# Patient Record
Sex: Male | Born: 1935 | Race: White | Hispanic: No | Marital: Married | Smoking: Never smoker
Health system: Southern US, Community
[De-identification: ages and names within clinical notes are randomized; demographics above are authoritative.]

## PROBLEM LIST (undated history)

## (undated) DIAGNOSIS — C61 Malignant neoplasm of prostate: Secondary | ICD-10-CM

## (undated) DIAGNOSIS — R011 Cardiac murmur, unspecified: Secondary | ICD-10-CM

## (undated) DIAGNOSIS — I82409 Acute embolism and thrombosis of unspecified deep veins of unspecified lower extremity: Secondary | ICD-10-CM

## (undated) DIAGNOSIS — Z8673 Personal history of transient ischemic attack (TIA), and cerebral infarction without residual deficits: Secondary | ICD-10-CM

## (undated) DIAGNOSIS — N183 Chronic kidney disease, stage 3 unspecified: Secondary | ICD-10-CM

## (undated) DIAGNOSIS — I779 Disorder of arteries and arterioles, unspecified: Secondary | ICD-10-CM

## (undated) DIAGNOSIS — T24031A Burn of unspecified degree of right lower leg, initial encounter: Secondary | ICD-10-CM

## (undated) DIAGNOSIS — E039 Hypothyroidism, unspecified: Secondary | ICD-10-CM

## (undated) DIAGNOSIS — R0683 Snoring: Secondary | ICD-10-CM

## (undated) DIAGNOSIS — A419 Sepsis, unspecified organism: Secondary | ICD-10-CM

## (undated) DIAGNOSIS — R238 Other skin changes: Secondary | ICD-10-CM

## (undated) DIAGNOSIS — C7951 Secondary malignant neoplasm of bone: Secondary | ICD-10-CM

## (undated) DIAGNOSIS — R233 Spontaneous ecchymoses: Secondary | ICD-10-CM

## (undated) DIAGNOSIS — I350 Nonrheumatic aortic (valve) stenosis: Secondary | ICD-10-CM

## (undated) DIAGNOSIS — I739 Peripheral vascular disease, unspecified: Secondary | ICD-10-CM

## (undated) DIAGNOSIS — J189 Pneumonia, unspecified organism: Secondary | ICD-10-CM

## (undated) DIAGNOSIS — Z973 Presence of spectacles and contact lenses: Secondary | ICD-10-CM

## (undated) HISTORY — DX: Spontaneous ecchymoses: R23.3

## (undated) HISTORY — DX: Other skin changes: R23.8

## (undated) HISTORY — DX: Snoring: R06.83

---

## 1993-10-29 HISTORY — PX: RETROPUBIC PROSTATECTOMY: SUR1055

## 2002-02-24 ENCOUNTER — Encounter (INDEPENDENT_AMBULATORY_CARE_PROVIDER_SITE_OTHER): Payer: Self-pay | Admitting: *Deleted

## 2002-02-24 ENCOUNTER — Ambulatory Visit (HOSPITAL_BASED_OUTPATIENT_CLINIC_OR_DEPARTMENT_OTHER): Admission: RE | Admit: 2002-02-24 | Discharge: 2002-02-24 | Payer: Self-pay | Admitting: Surgery

## 2002-02-24 HISTORY — PX: INGUINAL HERNIA REPAIR: SUR1180

## 2003-07-08 ENCOUNTER — Encounter: Payer: Self-pay | Admitting: Urology

## 2003-07-08 ENCOUNTER — Encounter: Admission: RE | Admit: 2003-07-08 | Discharge: 2003-07-08 | Payer: Self-pay | Admitting: Urology

## 2003-07-16 ENCOUNTER — Encounter: Payer: Self-pay | Admitting: Urology

## 2003-07-16 ENCOUNTER — Ambulatory Visit (HOSPITAL_COMMUNITY): Admission: RE | Admit: 2003-07-16 | Discharge: 2003-07-16 | Payer: Self-pay | Admitting: Urology

## 2003-07-16 ENCOUNTER — Encounter (INDEPENDENT_AMBULATORY_CARE_PROVIDER_SITE_OTHER): Payer: Self-pay | Admitting: Specialist

## 2004-01-25 ENCOUNTER — Encounter: Admission: RE | Admit: 2004-01-25 | Discharge: 2004-01-25 | Payer: Self-pay | Admitting: Urology

## 2004-01-28 ENCOUNTER — Encounter: Admission: RE | Admit: 2004-01-28 | Discharge: 2004-01-28 | Payer: Self-pay | Admitting: Urology

## 2004-02-02 ENCOUNTER — Ambulatory Visit (HOSPITAL_BASED_OUTPATIENT_CLINIC_OR_DEPARTMENT_OTHER): Admission: RE | Admit: 2004-02-02 | Discharge: 2004-02-02 | Payer: Self-pay | Admitting: Urology

## 2004-02-02 ENCOUNTER — Ambulatory Visit (HOSPITAL_COMMUNITY): Admission: RE | Admit: 2004-02-02 | Discharge: 2004-02-02 | Payer: Self-pay | Admitting: Urology

## 2004-02-02 ENCOUNTER — Encounter (INDEPENDENT_AMBULATORY_CARE_PROVIDER_SITE_OTHER): Payer: Self-pay | Admitting: Specialist

## 2004-02-02 HISTORY — PX: ORCHIECTOMY: SHX2116

## 2007-09-03 ENCOUNTER — Ambulatory Visit: Payer: Self-pay | Admitting: Internal Medicine

## 2007-09-23 ENCOUNTER — Ambulatory Visit: Payer: Self-pay | Admitting: Internal Medicine

## 2007-09-23 ENCOUNTER — Encounter: Payer: Self-pay | Admitting: Internal Medicine

## 2011-03-16 NOTE — Op Note (Signed)
[place]. [facility]  Patient:    Richard Jacobs, Richard Jacobs Visit Number: [id number] MRN: [id number]          Service Type: DSU Location: [facility] Attending Physician:  Katha Cabal Dictated by:   [place] Daphine Deutscher, M.D. Proc. Date: 02/24/02 Admit Date:  02/24/2002   CC:         Richard A. Jacky Kindle, M.D.  Ronald L. Ovidio Hanger, M.D.   Operative Report  CCS NO:  [id number].  PREOPERATIVE DIAGNOSIS:  Right inguinal hernia.  POSTOPERATIVE DIAGNOSIS:  Right indirect inguinal hernia.  PROCEDURE:  Right inguinal herniorrhaphy with mesh.  SURGEON:  Thornton Park. Daphine Deutscher, M.D.  ANESTHESIA:  Deep MAC.  DESCRIPTION OF PROCEDURE:  Mr. Brandis is a 77 year old gentleman who was brought to the OR3 at [facility] and given sedation.  His right inguinal region was shaved, prepped with Betadine and draped sterilely.  I did a regional block with a mixture of Marcaine and lidocaine; injecting the anterior and superior iliac spine region and also in a small oblique incision.  An incision was made and carried down to the external oblique.  These were incised along the fibers.  The cord was mobilized and the floor identified. ilioinguinal nerve branch was spared.  A large sac was found and was dissected free from the cord structures opened.  With my finger inside I dissected from the cord structures with gentle blunt dissection.  I then carried this up to the internal ring, where I then with my finger inside feeling around, I then twisted it off and then closed out with a figure-of-eight suture of 2-0 silk. The excess sac was cut and sent as a sac.  Viscera retracted up into the abdomen.  The floor was then repaired with a piece of mesh cut to fit, using regular polypropylene Davol mesh.  It was sutured with a Prolene along the inguinal ligament, and sutured medially with a running 2-0 Prolene.  It was crossed around the cord and sutured to itself with a single  horizontal mattress suture of 2-0 Prolene.  There was adequate room for the cord structures.  The external oblique was then closed in a running 2-0 Vicryl. Then 4-0 Vicryl was used in the subcutaneous tissue, and then subcuticularly. Final closure was effective with a 5-0 Vicryl in a running subcuticular fashion.  Benzoin and Steri-Strips were used on the skin.  The patient tolerated the procedure well.  He was taken to the recovery room.  He will be given some Tylox to take for pain.  He will be followed up in the office in approximately three weeks. Dictated by:   Thornton Park Daphine Deutscher, M.D. Attending Physician:  Katha Cabal DD:  02/24/02 TD:  02/24/02 Job: [id number]

## 2011-03-16 NOTE — Op Note (Signed)
NAME:  LAMOND, GLANTZ NO.:  [id number]   MEDICAL RECORD NO.:  [id number]                   PATIENT TYPE:  AMB   LOCATION:  [facility]                                 FACILITY:  [facility]   PHYSICIAN:  Ronald L. Ovidio Hanger, M.D.           DATE OF BIRTH:  Mar 24, 1928   DATE OF PROCEDURE:  02/02/2004  DATE OF DISCHARGE:                                 OPERATIVE REPORT   PREOPERATIVE DIAGNOSES:  Progressive prostate cancer.   POSTOPERATIVE DIAGNOSES:  Progressive prostate cancer.   OPERATION:  Bilateral scrotal orchiectomy.   SURGEON:  Lucrezia Starch. Earlene Plater, M.D.   ANESTHESIA:  LMA   ESTIMATED BLOOD LOSS:  15 mL   TUBES:  None.   COMPLICATIONS:  None.   INDICATIONS FOR PROCEDURE:  Mr. Rueda is a very nice 77 year old white  male who is status post radical prostatectomy in the distant past followed  by external beam radiation therapy. He has had some increasing PSA and has  subsequently developed obvious bone metastatic disease with a rapid doubling  time, his last PSA was 44.96, PAP enzymatically was 5.2 and after  understanding the risks, benefits, and alternatives, he has elected to  proceed with bilateral scrotal orchiectomy.   DESCRIPTION OF PROCEDURE:  The patient was placed in the supine position and  after proper LMA anesthesia was prepped and draped with Betadine in a  sterile fashion.  A midline scrotal incision was made and sharp dissection  was carried down through dartos tunic. The left tunica vaginalis was  incised, the left testicle and adnexa were delivered in the operative field.  The cord was taken in three separate packets with hemostats and cut and  ligated with #0 silk stick ties followed by #0 chromic hand ties on each  bundle. A similar orchiectomy was performed on the right side, specimen  submitted to pathology.  His right and left sides thorough irrigation was  performed.  Dartos tunic was closed incorporating the median septum with  a  running 3-0 chromic catgut and the skin was closed with a running horizontal  mattress 3-0 chromic catgut and dressed with collodion fluffs and scrotal  support. The patient tolerated the procedure well with no complications.                                               Ronald L. Ovidio Hanger, M.D.    RLD/MEDQ  D:  02/02/2004  T:  02/02/2004  Job:  [id number]

## 2012-04-11 ENCOUNTER — Telehealth: Payer: Self-pay | Admitting: Oncology

## 2012-04-11 NOTE — Telephone Encounter (Signed)
lmonvm for pt to call me re appt w/FS.

## 2012-04-14 ENCOUNTER — Telehealth: Payer: Self-pay | Admitting: Oncology

## 2012-04-14 NOTE — Telephone Encounter (Signed)
Pt returned call and was given new pt appt for 6/21 @ 10:30 am.

## 2012-04-15 ENCOUNTER — Telehealth: Payer: Self-pay | Admitting: Oncology

## 2012-04-15 NOTE — Telephone Encounter (Signed)
Referred by Dr. Gaynelle Arabian Dx- Prostate Ca

## 2012-04-17 ENCOUNTER — Other Ambulatory Visit: Payer: Self-pay | Admitting: Oncology

## 2012-04-17 DIAGNOSIS — C61 Malignant neoplasm of prostate: Secondary | ICD-10-CM | POA: Insufficient documentation

## 2012-04-18 ENCOUNTER — Other Ambulatory Visit (HOSPITAL_BASED_OUTPATIENT_CLINIC_OR_DEPARTMENT_OTHER): Payer: Medicare Other | Admitting: Lab

## 2012-04-18 ENCOUNTER — Ambulatory Visit (HOSPITAL_BASED_OUTPATIENT_CLINIC_OR_DEPARTMENT_OTHER): Payer: Medicare Other | Admitting: Oncology

## 2012-04-18 ENCOUNTER — Ambulatory Visit: Payer: Self-pay

## 2012-04-18 ENCOUNTER — Telehealth: Payer: Self-pay | Admitting: Oncology

## 2012-04-18 VITALS — BP 141/74 | HR 86 | Temp 96.9°F | Ht 68.5 in | Wt 213.8 lb

## 2012-04-18 DIAGNOSIS — C61 Malignant neoplasm of prostate: Secondary | ICD-10-CM

## 2012-04-18 LAB — CBC WITH DIFFERENTIAL/PLATELET
BASO%: 0.4 % (ref 0.0–2.0)
EOS%: 3.8 % (ref 0.0–7.0)
MCH: 30.9 pg (ref 27.2–33.4)
MCHC: 33.8 g/dL (ref 32.0–36.0)
MONO#: 0.4 10*3/uL (ref 0.1–0.9)
RBC: 4.38 10*6/uL (ref 4.20–5.82)
WBC: 6 10*3/uL (ref 4.0–10.3)
lymph#: 2 10*3/uL (ref 0.9–3.3)

## 2012-04-18 LAB — COMPREHENSIVE METABOLIC PANEL
ALT: 17 U/L (ref 0–53)
AST: 19 U/L (ref 0–37)
CO2: 25 mEq/L (ref 19–32)
Calcium: 9.4 mg/dL (ref 8.4–10.5)
Chloride: 108 mEq/L (ref 96–112)
Sodium: 144 mEq/L (ref 135–145)
Total Bilirubin: 0.4 mg/dL (ref 0.3–1.2)
Total Protein: 6.1 g/dL (ref 6.0–8.3)

## 2012-04-18 LAB — PSA: PSA: 15.31 ng/mL — ABNORMAL HIGH (ref <=4.00)

## 2012-04-18 NOTE — Progress Notes (Signed)
Note dictated

## 2012-04-18 NOTE — Progress Notes (Signed)
CC:   Richard Jacobs, M.D. Richard Jacobs, M.D.  REASON FOR CONSULTATION:  Prostate cancer.  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  He is a very pleasant, rather healthy gentleman without any significant comorbid conditions with history of prostate cancer that dates back to 1995.  At that time, he had an elevated a PSA around 16 or so, and had definitive therapy that includes a radical prostatectomy on October 12, 1994.  Pathology showed a Gleason score 3 + 4 = 7 with extracapsular extension with seminal vesicles and lymph nodes not involved.  That is case number [id number].  The patient had a nadir of less than 0.1.  However in November 1996, his PSA was up to 0.3 and subsequently in February of 1997 was to 0.44.  Patient was treated with radiation therapy under the care of Dr. Dan Humphreys in February of 1999 with a PSA nadir down to 0.27.  Over the next few years, he had a slow rise in his PSA up to 18.24 back in September of 2004.  At that time, he had elected to proceed with bilateral orchiectomy in 07/2005, and his PSA dropped down to 1.0 in 2011, up to 1.58 later on in 2011 and, most recently, his PSA went from 7.04 in 2012 up 11 in March of 2013, and most recently, in about 3 months, PSA went up to 19.47, again indicating doubling time is right about or less than 6 months.  The patient in the past has had a staging workup including a bone scan back in March of 2005 that showed a increased finding of a bony metastasis involving T11- T12.  That was biopsied and apparently was negative for metastasis.  So he is really presumed not have bony disease for the last 8 years.  He is clinically asymptomatic.  He did not report any chest pain, orthopnea. He does not report any back pain, shoulder pain, hip pain.  Overall, performance status, activity level remain really unchanged.   He is very active.  He takes care of about 23 acres in his land and performs most activities of daily living without any hindrance or decline.  PAST MEDICAL HISTORY:  Significant for hypothyroidism, nephrolithiasis, history of osteoarthritis.  MEDICATIONS:  He is on Synthroid 50 mcg daily.  ALLERGIES:  None.  FAMILY HISTORY:  He has a brother who has an elevated PSA, presumably low-grade prostate cancer.  His father died of pancreatic cancer in his 107s.  Mother died of liver disease, it is a little unclear whether it is liver cancer, in her 86s.  He has 1 sister who had breast cancer in her 49s.  SOCIAL HISTORY:  He is married.  He has 3 sons.  Denied any alcohol or tobacco abuse.  Currently retired.  PHYSICAL EXAMINATION:  General:  Alert, awake gentleman, appeared in no active distress.  His ECOG performance status is zero.  Vital signs: His blood pressure is 141/74, pulse 86, respiration 18, he is afebrile. Weight is 213 pounds.  HEENT:  Head is normocephalic, atraumatic. Pupils equal, round, reactive to light.  Oral mucosa moist and pink. Neck:  Supple without lymphadenopathy.  Heart:  Regular rate and rhythm. S1, S2.  Lungs:  Clear.  Abdomen:  Soft.  Extremities:  No clubbing, cyanosis, edema.  Neurological:  Intact motor, sensory, and deep tendon reflexes.  LABORATORY DATA:  Showed a hemoglobin of 13, white cell count 6.0,  platelet count was 60.  ASSESSMENT AND PLAN:  This is an 78 year old gentleman following issues: Prostate cancer.  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  His initial diagnosis back in 1995, he had a Gleason score of 3 + 4 = 7, PSA about 16.  He is status post radical prostatectomy.  He had adjuvant radiation therapy in 1997 with a nadir PSA down to 0.27. In 2006 had hormonal deprivation in the form of a surgical orchiectomy bilaterally with an excellent PSA response.  Since 2011 he had a rather slow rise in his PSA up to 19 with a  doubling time now less than 6 months.  I discussed today in detail with Richard Jacobs the natural course of prostate cancer, more specifically castration-resistant disease.  I have talked to him about really the staging workup that includes a bone scan and CT scan.  Richard Jacobs was very adamant that he did not want to have any staging studies done as he believes it is not indicated.  I have discussed with Richard Jacobs really the natural course and the need for [place] to determine whether he has indeed measurable disease.  Prostate cancer is known to travel to the bone and certainly in a period of 8 years can travel and cause bony metastasis even asymptomatic patients.  After obtaining a bone scan, then the treatment options today discussed depending on whether he has measurable disease. If he does not have measurable disease, then watch and wait approach certainly is a possibility.  Anti-androgen deprivation with antiandrogens such as Casodex will be added as a possibility.  Should he have measurable disease in the bone, then the equation will be different.  He will need bone-directed therapy, in addition to possible antiandrogen therapy versus second-line hormone manipulation with ketoconazole and prednisone or Zytiga and possibly Provenge immunotherapy.  He would not need chemotherapy at this time, given the fact that he is asymptomatic and has a slow growing PSA.  Again, I had a lengthy discussion today with Richard Jacobs discussing the biology of prostate cancer, specifically when it comes down to bony metastasis and as well as skeletal-related complications to include pathological fractures, bony pain, need for radiation therapy, cord compression.  All of these diseases were discussed today in detail and really stems the need for knowing if he has bony metastasis or not.  He is agreeable to proceed with that and I will communicate these results to him and will dictate the next step  depending on these results.  All his questions were answered today.    ______________________________ Benjiman Core, M.D. FNS/MEDQ  D:  04/18/2012  T:  04/18/2012  Job:  [id number]

## 2012-04-18 NOTE — Telephone Encounter (Signed)
scheduled pt for bone scan on 06/28 @ [facility]

## 2012-04-25 ENCOUNTER — Encounter (HOSPITAL_COMMUNITY)
Admission: RE | Admit: 2012-04-25 | Discharge: 2012-04-25 | Disposition: A | Discharge status: Home / Self Care | Payer: Medicare Other | Source: Ambulatory Visit | Attending: Oncology | Admitting: Oncology

## 2012-04-25 DIAGNOSIS — C61 Malignant neoplasm of prostate: Secondary | ICD-10-CM

## 2012-04-25 MED ORDER — TECHNETIUM TC 99M MEDRONATE IV KIT
25.0000 | PACK | Freq: Once | INTRAVENOUS | Status: AC | PRN
  Administered 2012-04-25: 25 via INTRAVENOUS

## 2012-05-06 ENCOUNTER — Encounter: Payer: Self-pay | Admitting: Oncology

## 2012-05-06 NOTE — Progress Notes (Signed)
The results of the PSA and bone scan dicussed. I recommend observation and surveillance for the time being.  He is agreeable with that but would like to do that with Dr. Earlene Plater. I instructed him that I would be happy to get him in if the PSA started to go up again to discuss different treatment options.

## 2012-10-17 NOTE — Progress Notes (Signed)
Received office notes from Dr. Gaynelle Arabian; forwarded to Dr. Clelia Croft.

## 2013-09-03 ENCOUNTER — Other Ambulatory Visit: Payer: Self-pay | Admitting: Dermatology

## 2013-10-02 ENCOUNTER — Inpatient Hospital Stay (HOSPITAL_COMMUNITY): Payer: Medicare Other

## 2013-10-02 ENCOUNTER — Emergency Department (HOSPITAL_COMMUNITY): Payer: Medicare Other

## 2013-10-02 ENCOUNTER — Inpatient Hospital Stay (HOSPITAL_COMMUNITY)
Admission: EM | Admit: 2013-10-02 | Discharge: 2013-10-04 | DRG: 065 | Disposition: A | Discharge status: Home Health | Payer: Medicare Other | Attending: Family Medicine | Admitting: Family Medicine

## 2013-10-02 ENCOUNTER — Encounter (HOSPITAL_COMMUNITY): Payer: Self-pay | Admitting: Emergency Medicine

## 2013-10-02 DIAGNOSIS — I6350 Cerebral infarction due to unspecified occlusion or stenosis of unspecified cerebral artery: Principal | ICD-10-CM | POA: Diagnosis present

## 2013-10-02 DIAGNOSIS — R29810 Facial weakness: Secondary | ICD-10-CM | POA: Diagnosis present

## 2013-10-02 DIAGNOSIS — Z7982 Long term (current) use of aspirin: Secondary | ICD-10-CM

## 2013-10-02 DIAGNOSIS — Z8546 Personal history of malignant neoplasm of prostate: Secondary | ICD-10-CM

## 2013-10-02 DIAGNOSIS — Z923 Personal history of irradiation: Secondary | ICD-10-CM

## 2013-10-02 DIAGNOSIS — Z87891 Personal history of nicotine dependence: Secondary | ICD-10-CM

## 2013-10-02 DIAGNOSIS — E039 Hypothyroidism, unspecified: Secondary | ICD-10-CM | POA: Diagnosis present

## 2013-10-02 DIAGNOSIS — I639 Cerebral infarction, unspecified: Secondary | ICD-10-CM | POA: Diagnosis present

## 2013-10-02 DIAGNOSIS — C61 Malignant neoplasm of prostate: Secondary | ICD-10-CM

## 2013-10-02 DIAGNOSIS — Z79899 Other long term (current) drug therapy: Secondary | ICD-10-CM

## 2013-10-02 DIAGNOSIS — C7951 Secondary malignant neoplasm of bone: Secondary | ICD-10-CM | POA: Diagnosis present

## 2013-10-02 DIAGNOSIS — R4781 Slurred speech: Secondary | ICD-10-CM

## 2013-10-02 DIAGNOSIS — Z7902 Long term (current) use of antithrombotics/antiplatelets: Secondary | ICD-10-CM

## 2013-10-02 DIAGNOSIS — R4789 Other speech disturbances: Secondary | ICD-10-CM | POA: Diagnosis present

## 2013-10-02 DIAGNOSIS — Z23 Encounter for immunization: Secondary | ICD-10-CM

## 2013-10-02 HISTORY — DX: Hypothyroidism, unspecified: E03.9

## 2013-10-02 LAB — CBC WITH DIFFERENTIAL/PLATELET
Eosinophils Absolute: 0.2 10*3/uL (ref 0.0–0.7)
Hemoglobin: 14 g/dL (ref 13.0–17.0)
Lymphocytes Relative: 27 % (ref 12–46)
Lymphs Abs: 1.5 10*3/uL (ref 0.7–4.0)
MCH: 31.3 pg (ref 26.0–34.0)
Monocytes Relative: 8 % (ref 3–12)
Neutro Abs: 3.4 10*3/uL (ref 1.7–7.7)
Neutrophils Relative %: 61 % (ref 43–77)
Platelets: 165 10*3/uL (ref 150–400)
RBC: 4.47 MIL/uL (ref 4.22–5.81)
WBC: 5.6 10*3/uL (ref 4.0–10.5)

## 2013-10-02 LAB — CBC
HCT: 42.3 % (ref 39.0–52.0)
Platelets: 179 10*3/uL (ref 150–400)
RBC: 4.65 MIL/uL (ref 4.22–5.81)
RDW: 13.2 % (ref 11.5–15.5)
WBC: 9.9 10*3/uL (ref 4.0–10.5)

## 2013-10-02 LAB — COMPREHENSIVE METABOLIC PANEL
ALT: 16 U/L (ref 0–53)
Alkaline Phosphatase: 94 U/L (ref 39–117)
BUN: 24 mg/dL — ABNORMAL HIGH (ref 6–23)
CO2: 25 mEq/L (ref 19–32)
Chloride: 106 mEq/L (ref 96–112)
GFR calc Af Amer: 67 mL/min — ABNORMAL LOW (ref >90)
GFR calc non Af Amer: 58 mL/min — ABNORMAL LOW (ref >90)
Glucose, Bld: 94 mg/dL (ref 70–99)
Potassium: 4.8 mEq/L (ref 3.5–5.1)
Sodium: 141 mEq/L (ref 135–145)
Total Bilirubin: 0.5 mg/dL (ref 0.3–1.2)

## 2013-10-02 LAB — URINALYSIS, ROUTINE W REFLEX MICROSCOPIC
Glucose, UA: NEGATIVE mg/dL
Ketones, ur: NEGATIVE mg/dL
Leukocytes, UA: NEGATIVE
Nitrite: NEGATIVE
pH: 5 (ref 5.0–8.0)

## 2013-10-02 LAB — ETHANOL: Alcohol, Ethyl (B): <11 mg/dL (ref 0–11)

## 2013-10-02 LAB — CREATININE, SERUM
Creatinine, Ser: 1.29 mg/dL (ref 0.50–1.35)
GFR calc Af Amer: 57 mL/min — ABNORMAL LOW (ref >90)

## 2013-10-02 LAB — RAPID URINE DRUG SCREEN, HOSP PERFORMED
Barbiturates: NOT DETECTED
Benzodiazepines: NOT DETECTED

## 2013-10-02 LAB — PROTIME-INR
INR: 1.03 (ref 0.00–1.49)
Prothrombin Time: 13.3 seconds (ref 11.6–15.2)

## 2013-10-02 MED ORDER — ASPIRIN 300 MG RE SUPP
300.0000 mg | Freq: Every day | RECTAL | Status: DC
  Filled 2013-10-02 (×2): qty 1

## 2013-10-02 MED ORDER — ACETAMINOPHEN 325 MG PO TABS: 650.0000 mg | ORAL_TABLET | Freq: Four times a day (QID) | ORAL | Status: DC | PRN

## 2013-10-02 MED ORDER — ENOXAPARIN SODIUM 40 MG/0.4ML ~~LOC~~ SOLN
40.0000 mg | SUBCUTANEOUS | Status: DC
  Administered 2013-10-02 – 2013-10-03 (×2): 40 mg via SUBCUTANEOUS
  Filled 2013-10-02 (×3): qty 0.4

## 2013-10-02 MED ORDER — ASPIRIN 325 MG PO TABS
325.0000 mg | ORAL_TABLET | Freq: Every day | ORAL | Status: DC
  Administered 2013-10-02: 325 mg via ORAL
  Filled 2013-10-02: qty 1

## 2013-10-02 MED ORDER — OXYCODONE HCL 5 MG PO TABS: 5.0000 mg | ORAL_TABLET | Freq: Four times a day (QID) | ORAL | Status: DC | PRN

## 2013-10-02 MED ORDER — SENNOSIDES-DOCUSATE SODIUM 8.6-50 MG PO TABS: 1.0000 | ORAL_TABLET | Freq: Every evening | ORAL | Status: DC | PRN

## 2013-10-02 MED ORDER — ATORVASTATIN CALCIUM 40 MG PO TABS
40.0000 mg | ORAL_TABLET | Freq: Every day | ORAL | Status: DC
  Administered 2013-10-03: 40 mg via ORAL
  Filled 2013-10-02 (×2): qty 1

## 2013-10-02 MED ORDER — LEVOTHYROXINE SODIUM 50 MCG PO TABS
50.0000 ug | ORAL_TABLET | Freq: Every day | ORAL | Status: DC
  Administered 2013-10-03 – 2013-10-04 (×2): 50 ug via ORAL
  Filled 2013-10-02 (×3): qty 1

## 2013-10-02 MED ORDER — ASPIRIN 325 MG PO TABS
325.0000 mg | ORAL_TABLET | Freq: Every day | ORAL | Status: DC
  Administered 2013-10-03 – 2013-10-04 (×2): 325 mg via ORAL
  Filled 2013-10-02 (×2): qty 1

## 2013-10-02 NOTE — ED Provider Notes (Signed)
Medical screening examination/treatment/procedure(s) were conducted as a shared visit with non-physician practitioner(s) and myself.  I personally evaluated the patient during the encounter. EKG interpreted by me.  I agree with above documentation.    Consult neuro for likely stroke.  CT neg.  MRI ordered based on Neuro recommendations.  Admit to hospitalist services.  No issues during ER stay.     Darlys Gales, MD 10/02/13 [phone]

## 2013-10-02 NOTE — Progress Notes (Signed)
ED reported off on pt at 1832 and pt arrived on unit in room 1855. Pt spouse at bedside, pt transported via bed with RN at bedside. Pt placed on Tele; alert and oriented, follow commands. Pt oriented to unit and room, call bell at bedside. Pt denies any pain, skin dry and intact. Pt comfortable in bed and reported off to incoming RN.

## 2013-10-02 NOTE — ED Notes (Signed)
Called MRI regarding status. States that it will be approx 1 hr.

## 2013-10-02 NOTE — Consult Note (Signed)
Referring Physician: Vanessa Barbara    Chief Complaint: slurred speech and facial Droop  HPI:                                                                                                                                         Richard Jacobs is an 79 y.o. male who has history of prostate cancer and hypothyroidism.  Last night at approximately 2300 hours he noted his speech was slurred and he had a left facial droop and slight feeling of being off balance but not listing or leaning to one side.   Patient did not seek medical attention at that time.  Upon waking his symptoms had not fully cleared and wife became worried he may have had a stroke. Patient was brought to ED for further evaluation.  Initial CT head was negative for acute CVA but follow up MRI revealed a right parietal and cortical infarct in the right MCA distribution. Patient was not a tPA candidate due to LSN >8 hours prior. Neurology was consulted.    Date last known well: Date: 10/01/2013 Time last known well: Time: 23:00 tPA Given: No: out of the window  Past Medical History  Diagnosis Date  . Thyroid disease   . Cancer     prostate    History reviewed. No pertinent past surgical history.  History reviewed. No pertinent family history. Social History:  reports that he has quit smoking. His smokeless tobacco use includes Chew. He reports that he drinks alcohol. He reports that he does not use illicit drugs.  Allergies: No Known Allergies  Medications:                                                                                                                           Current Facility-Administered Medications  Medication Dose Route Frequency Provider Last Rate Last Dose  . aspirin tablet 325 mg  325 mg Oral Daily Garlon Hatchet, PA-C   325 mg at 10/02/13 1550   Current Outpatient Prescriptions  Medication Sig Dispense Refill  . levothyroxine (SYNTHROID, LEVOTHROID) 50 MCG tablet Take 50 mcg by mouth daily.          ROS:  History obtained from the patient  General ROS: negative for - chills, fatigue, fever, night sweats, weight gain or weight loss Psychological ROS: negative for - behavioral disorder, hallucinations, memory difficulties, mood swings or suicidal ideation Ophthalmic ROS: negative for - blurry vision, double vision, eye pain or loss of vision ENT ROS: negative for - epistaxis, nasal discharge, oral lesions, sore throat, tinnitus or vertigo Allergy and Immunology ROS: negative for - hives or itchy/watery eyes Hematological and Lymphatic ROS: negative for - bleeding problems, bruising or swollen lymph nodes Endocrine ROS: negative for - galactorrhea, hair pattern changes, polydipsia/polyuria or temperature intolerance Respiratory ROS: negative for - cough, hemoptysis, shortness of breath or wheezing Cardiovascular ROS: negative for - chest pain, dyspnea on exertion, edema or irregular heartbeat Gastrointestinal ROS: negative for - abdominal pain, diarrhea, hematemesis, nausea/vomiting or stool incontinence Genito-Urinary ROS: negative for - dysuria, hematuria, incontinence or urinary frequency/urgency Musculoskeletal ROS: negative for - joint swelling or muscular weakness Neurological ROS: as noted in HPI Dermatological ROS: negative for rash and skin lesion changes  Neurologic Examination:                                                                                                      Blood pressure 136/72, pulse 61, temperature 98.9 F (37.2 C), temperature source Oral, resp. rate 17, SpO2 98.00%.  Mental Status: Alert, oriented, thought content appropriate.  Speech dysarthric without evidence of aphasia.  Able to follow 3 step commands without difficulty. Cranial Nerves: II: Discs flat bilaterally; Visual fields grossly normal, pupils equal,  round, reactive to light and accommodation III,IV, VI: ptosis not present, extra-ocular motions intact bilaterally V,VII: smile symmetric, facial light touch sensation normal bilaterally VIII: hearing normal bilaterally IX,X: gag reflex present XI: bilateral shoulder shrug XII: midline tongue extension without atrophy or fasciculations  Motor: Right : Upper extremity   5/5    Left:     Upper extremity   5/5  Lower extremity   5/5     Lower extremity   4/5 Tone and bulk:normal tone throughout; no atrophy noted Sensory: Pinprick and light touch intact throughout, bilaterally Deep Tendon Reflexes:  Right: Upper Extremity   Left: Upper extremity   biceps (C-5 to C-6) 2/4   biceps (C-5 to C-6) 2/4 tricep (C7) 2/4    triceps (C7) 2/4 Brachioradialis (C6) 2/4  Brachioradialis (C6) 2/4  Lower Extremity Lower Extremity  quadriceps (L-2 to L-4) 2/4   quadriceps (L-2 to L-4) 2/4 Achilles (S1) 2/4   Achilles (S1) 2/4  Plantars: Right: downgoing   Left: downgoing Cerebellar: normal finger-to-nose,  normal heel-to-shin test Gait: normal CV: pulses palpable throughout    Results for orders placed during the hospital encounter of 10/02/13 (from the past 48 hour(s))  GLUCOSE, CAPILLARY     Status: None   Collection Time    10/02/13  1:21 PM      Result Value Range   Glucose-Capillary 89  70 - 99 mg/dL  PROTIME-INR     Status: None   Collection Time    10/02/13  1:57 PM  Result Value Range   Prothrombin Time 13.3  11.6 - 15.2 seconds   INR 1.03  0.00 - 1.49  APTT     Status: None   Collection Time    10/02/13  1:57 PM      Result Value Range   aPTT 31  24 - 37 seconds  CBC WITH DIFFERENTIAL     Status: None   Collection Time    10/02/13  1:57 PM      Result Value Range   WBC 5.6  4.0 - 10.5 K/uL   RBC 4.47  4.22 - 5.81 MIL/uL   Hemoglobin 14.0  13.0 - 17.0 g/dL   HCT 96.0  45.4 - 09.8 %   MCV 90.6  78.0 - 100.0 fL   MCH 31.3  26.0 - 34.0 pg   MCHC 34.6  30.0 - 36.0 g/dL    RDW 11.9  14.7 - 82.9 %   Platelets 165  150 - 400 K/uL   Neutrophils Relative % 61  43 - 77 %   Neutro Abs 3.4  1.7 - 7.7 K/uL   Lymphocytes Relative 27  12 - 46 %   Lymphs Abs 1.5  0.7 - 4.0 K/uL   Monocytes Relative 8  3 - 12 %   Monocytes Absolute 0.4  0.1 - 1.0 K/uL   Eosinophils Relative 3  0 - 5 %   Eosinophils Absolute 0.2  0.0 - 0.7 K/uL   Basophils Relative 1  0 - 1 %   Basophils Absolute 0.1  0.0 - 0.1 K/uL  COMPREHENSIVE METABOLIC PANEL     Status: Abnormal   Collection Time    10/02/13  1:57 PM      Result Value Range   Sodium 141  135 - 145 mEq/L   Potassium 4.8  3.5 - 5.1 mEq/L   Chloride 106  96 - 112 mEq/L   CO2 25  19 - 32 mEq/L   Glucose, Bld 94  70 - 99 mg/dL   BUN 24 (*) 6 - 23 mg/dL   Creatinine, Ser 5.62  0.50 - 1.35 mg/dL   Calcium 9.1  8.4 - 13.0 mg/dL   Total Protein 6.7  6.0 - 8.3 g/dL   Albumin 3.5  3.5 - 5.2 g/dL   AST 21  0 - 37 U/L   ALT 16  0 - 53 U/L   Alkaline Phosphatase 94  39 - 117 U/L   Total Bilirubin 0.5  0.3 - 1.2 mg/dL   GFR calc non Af Amer 58 (*) >90 mL/min   GFR calc Af Amer 67 (*) >90 mL/min   Comment: (NOTE)     The eGFR has been calculated using the CKD EPI equation.     This calculation has not been validated in all clinical situations.     eGFR's persistently <90 mL/min signify possible Chronic Kidney     Disease.  ETHANOL     Status: None   Collection Time    10/02/13  1:57 PM      Result Value Range   Alcohol, Ethyl (B) <11  0 - 11 mg/dL   Comment:            LOWEST DETECTABLE LIMIT FOR     SERUM ALCOHOL IS 11 mg/dL     FOR MEDICAL PURPOSES ONLY  URINE RAPID DRUG SCREEN (HOSP PERFORMED)     Status: None   Collection Time    10/02/13  2:00 PM  Result Value Range   Opiates NONE DETECTED  NONE DETECTED   Cocaine NONE DETECTED  NONE DETECTED   Benzodiazepines NONE DETECTED  NONE DETECTED   Amphetamines NONE DETECTED  NONE DETECTED   Tetrahydrocannabinol NONE DETECTED  NONE DETECTED   Barbiturates NONE  DETECTED  NONE DETECTED   Comment:            DRUG SCREEN FOR MEDICAL PURPOSES     ONLY.  IF CONFIRMATION IS NEEDED     FOR ANY PURPOSE, NOTIFY LAB     WITHIN 5 DAYS.                LOWEST DETECTABLE LIMITS     FOR URINE DRUG SCREEN     Drug Class       Cutoff (ng/mL)     Amphetamine      1000     Barbiturate      200     Benzodiazepine   200     Tricyclics       300     Opiates          300     Cocaine          300     THC              50  URINALYSIS, ROUTINE W REFLEX MICROSCOPIC     Status: None   Collection Time    10/02/13  2:00 PM      Result Value Range   Color, Urine YELLOW  YELLOW   APPearance CLEAR  CLEAR   Specific Gravity, Urine 1.016  1.005 - 1.030   pH 5.0  5.0 - 8.0   Glucose, UA NEGATIVE  NEGATIVE mg/dL   Hgb urine dipstick NEGATIVE  NEGATIVE   Bilirubin Urine NEGATIVE  NEGATIVE   Ketones, ur NEGATIVE  NEGATIVE mg/dL   Protein, ur NEGATIVE  NEGATIVE mg/dL   Urobilinogen, UA 0.2  0.0 - 1.0 mg/dL   Nitrite NEGATIVE  NEGATIVE   Leukocytes, UA NEGATIVE  NEGATIVE   Comment: MICROSCOPIC NOT DONE ON URINES WITH NEGATIVE PROTEIN, BLOOD, LEUKOCYTES, NITRITE, OR GLUCOSE <1000 mg/dL.  POCT I-STAT TROPONIN I     Status: None   Collection Time    10/02/13  2:18 PM      Result Value Range   Troponin i, poc 0.00  0.00 - 0.08 ng/mL   Comment 3            Comment: Due to the release kinetics of cTnI,     a negative result within the first hours     of the onset of symptoms does not rule out     myocardial infarction with certainty.     If myocardial infarction is still suspected,     repeat the test at appropriate intervals.   Ct Head Wo Contrast  10/02/2013   CLINICAL DATA:  Significant headache  EXAM: CT HEAD WITHOUT CONTRAST  TECHNIQUE: Contiguous axial images were obtained from the base of the skull through the vertex without intravenous contrast.  COMPARISON:  None.  FINDINGS: No skull fracture is noted. No intracranial hemorrhage, mass effect or midline shift. Mild  nodular mucosal thickening left maxillary sinus probable mucous retention cysts. Mastoid air cells are unremarkable. Mild cerebral atrophy. Mild periventricular and patchy subcortical white matter decreased attenuation probable due to chronic small vessel ischemic changes. No acute cortical infarction. No mass lesion is noted on this unenhanced scan.  IMPRESSION: No acute intracranial  abnormality. Mild cerebral atrophy. Mild periventricular and patchy subcortical white matter decreased attenuation probable due to chronic small vessel ischemic changes.   Electronically Signed   By: Natasha Mead M.D.   On: 10/02/2013 13:54    Assessment and plan discussed with with attending physician and they are in agreement.    Felicie Morn PA-C [facility] [phone]  10/02/2013, 5:57 PM   Assessment: 79 y.o. male presenting with acute right parietal infarct suspicious for cardio embolic source.  Patient was LSN at 2300 hours 10/01/13 thus out of the window for tPA.    Stroke Risk Factors - none  Recommend:   Plan: 1. HgbA1c, fasting lipid panel 2. MRI, MRA  of the brain without contrast (done) 3. PT consult, OT consult, Speech consult 4. Echocardiogram 5. Carotid dopplers 6. Prophylactic therapy-Antiplatelet med: Aspirin - dose 81 mg daily 7. Risk  Factor modification   Patient seen and examined together with physician assistant and I concur with the assessment and plan.  Wyatt Portela, MD

## 2013-10-02 NOTE — ED Notes (Signed)
Per EMS- pt woke this morning at 1130am and noticed that he had slurred speech, lower lip numbness, left facial droop, and bilateral foot numbness. No weakness reported by EMS. LSN at 11pm last night. VSS with EMS.

## 2013-10-02 NOTE — ED Notes (Signed)
Patient transported to MRI 

## 2013-10-02 NOTE — ED Notes (Signed)
PA at bedside.

## 2013-10-02 NOTE — ED Notes (Signed)
In MRI

## 2013-10-02 NOTE — ED Notes (Signed)
CBG was 89 

## 2013-10-02 NOTE — ED Notes (Signed)
Pt in MRI.

## 2013-10-02 NOTE — ED Provider Notes (Signed)
CSN: [id number]     Arrival date & time 10/02/13  1303 History   First MD Initiated Contact with Patient 10/02/13 1318     Chief Complaint  Patient presents with  . Aphasia   (Consider location/radiation/quality/duration/timing/severity/associated sxs/prior Treatment) The history is provided by the patient and medical records.   This is an 79 year old male with past medical history significant for hypothyroidism, prostate cancer, presenting to the ED for slurred speech and left facial droop, last seen normal at 2300 last night.  Upon waking this morning he continued to have slurred speech, lower lip numbness, and left facial droop. Wife notes his speech seems to be improving but has still not returned to baseline.  No prior history of stroke or TIA. Patient not currently on any anticoagulants.  No recent medication changes. No illicit drugs or EtOH. Patient is alert and oriented on arrival, vital signs are stable.  Past Medical History  Diagnosis Date  . Thyroid disease   . Cancer     prostate   History reviewed. No pertinent past surgical history. History reviewed. No pertinent family history. History  Substance Use Topics  . Smoking status: Former Games developer  . Smokeless tobacco: Current User    Types: Chew  . Alcohol Use: Yes     Comment: occasional    Review of Systems  Neurological: Positive for facial asymmetry and speech difficulty.  All other systems reviewed and are negative.    Allergies  Review of patient's allergies indicates no known allergies.  Home Medications   Current Outpatient Rx  Name  Route  Sig  Dispense  Refill  . levothyroxine (SYNTHROID, LEVOTHROID) 50 MCG tablet   Oral   Take 50 mcg by mouth daily.          BP 152/88  Pulse 67  Temp(Src) 98.9 F (37.2 C) (Oral)  Resp 19  SpO2 99%  Physical Exam  Nursing note and vitals reviewed. Constitutional: He is oriented to person, place, and time. He appears well-developed and well-nourished. No  distress.  HENT:  Head: Normocephalic and atraumatic.  Mouth/Throat: Oropharynx is clear and moist.  Eyes: Conjunctivae, EOM and lids are normal. Pupils are equal, round, and reactive to light.  EOM intact, PERRL bilaterally  Neck: Normal range of motion. Neck supple.  Cardiovascular: Normal rate, regular rhythm and normal heart sounds.   Pulmonary/Chest: Effort normal and breath sounds normal. No respiratory distress. He has no wheezes.  Abdominal: Soft. Bowel sounds are normal. There is no tenderness. There is no guarding.  Musculoskeletal: Normal range of motion. He exhibits no edema.  Neurological: He is alert and oriented to person, place, and time. He has normal strength. He displays no tremor. No sensory deficit. He displays no seizure activity.  Alert and oriented x3, equal strength UE and LE bilaterally; moves all extremities appropriately without ataxia; finger to nose appropriate bilaterally; eyes will cross midline; asymmetric smile with left droop; forehead wrinkle present; sensation to light touch intact diffusely  Skin: Skin is warm and dry. He is not diaphoretic.  Psychiatric: He has a normal mood and affect. His behavior is normal. His speech is slurred.  Speech slurred    ED Course  Procedures (including critical care time)   Date: 10/02/2013  Rate: 74  Rhythm: normal sinus rhythm  QRS Axis: normal  Intervals: normal  ST/T Wave abnormalities: normal  Conduction Disutrbances:none  Narrative Interpretation:   Old EKG Reviewed: none available   Labs Review Labs Reviewed  COMPREHENSIVE METABOLIC PANEL -  Abnormal; Notable for the following:    BUN 24 (*)    GFR calc non Af Amer 58 (*)    GFR calc Af Amer 67 (*)    All other components within normal limits  GLUCOSE, CAPILLARY  PROTIME-INR  APTT  CBC WITH DIFFERENTIAL  ETHANOL  URINE RAPID DRUG SCREEN (HOSP PERFORMED)  URINALYSIS, ROUTINE W REFLEX MICROSCOPIC  POCT I-STAT TROPONIN I   Imaging Review Ct Head  Wo Contrast  10/02/2013   CLINICAL DATA:  Significant headache  EXAM: CT HEAD WITHOUT CONTRAST  TECHNIQUE: Contiguous axial images were obtained from the base of the skull through the vertex without intravenous contrast.  COMPARISON:  None.  FINDINGS: No skull fracture is noted. No intracranial hemorrhage, mass effect or midline shift. Mild nodular mucosal thickening left maxillary sinus probable mucous retention cysts. Mastoid air cells are unremarkable. Mild cerebral atrophy. Mild periventricular and patchy subcortical white matter decreased attenuation probable due to chronic small vessel ischemic changes. No acute cortical infarction. No mass lesion is noted on this unenhanced scan.  IMPRESSION: No acute intracranial abnormality. Mild cerebral atrophy. Mild periventricular and patchy subcortical white matter decreased attenuation probable due to chronic small vessel ischemic changes.   Electronically Signed   By: Natasha Mead M.D.   On: 10/02/2013 13:54    EKG Interpretation   None       MDM   1. Slurred speech   2. Facial droop   3. Hypothyroidism    Pt last seen normal over 12+ hours ago, code stroke not activated.  CT head negative for acute findings. ASA given.  Pt with continued slurred speech and asymmetric smile.  Discussed with neuro hospitalist, Dr. Leroy Kennedy-- advised to proceed with MRI brain,  medical admission and he will consult.  Spoke with Dr. Jacky Kindle from [facility], he would prefer hospitalist admission.  Consulted hospitalist, Dr. Vanessa Barbara-- he will admit.  VS stable at this time.  Garlon Hatchet, PA-C 10/02/13 1557  Garlon Hatchet, PA-C 10/02/13 [phone]

## 2013-10-02 NOTE — H&P (Signed)
[facility] History and Physical  LAEL PILCH ZOX:[id number] DOB: [birth year] DOA: 10/02/2013  Referring physician:  PCP: Minda Meo, MD  Specialists:   Chief Complaint: Slurred speech/facial droop  HPI: ULUS HAZEN is a 79 y.o. male with a past medical history of hypothyroidism and prostate cancer, presenting with complaints of slurred speech. He reports being in his usual state health, woke up this morning, family members noted him to have slurred speech as well as a right facial droop. Patient was last seen in his usual state health at 11:00 last night. He reported having associated lower lip numbness as well as being "off balance" however did not have any falls. He denies extremity weakness, or alteration to sensation of his extremities, denied history of CVA. Symptoms persisted through the day and while in the emergency department. CT scan of brain without contrast performed in the ED showing no acute intracranial abnormalities. He had not been on aspirin therapy prior to this presentation denied a history of CVA.  He was given 25 mg of aspirin in the emergency department. He currently denies nausea vomiting fevers chills chest pain shortness of breath.                                                                                    Review of Systems: The patient denies anorexia, fever, weight loss,, vision loss, decreased hearing, hoarseness, chest pain, syncope, dyspnea on exertion, peripheral edema, hemoptysis, abdominal pain, melena, hematochezia, severe indigestion/heartburn, hematuria, incontinence, genital sores, suspicious skin lesions, transient blindness, difficulty walking, depression, unusual weight change, abnormal bleeding, enlarged lymph nodes, angioedema, and breast masses.    Past Medical History  Diagnosis Date  . Thyroid disease   . Cancer     prostate   History reviewed. No pertinent past surgical history. Social History:  reports that he has  quit smoking. His smokeless tobacco use includes Chew. He reports that he drinks alcohol. He reports that he does not use illicit drugs.   No Known Allergies  Family History He reports having a brother with history of coronary artery disease, mother died secondary to hepatocellular carcinoma.   Prior to Admission medications   Medication Sig Start Date End Date Taking? Authorizing Provider  levothyroxine (SYNTHROID, LEVOTHROID) 50 MCG tablet Take 50 mcg by mouth daily.   Yes Historical Provider, MD   Physical Exam: Filed Vitals:   10/02/13 1515  BP: 136/72  Pulse: 61  Temp:   Resp: 17     General:  Patient is in no acute distress, he is awake alert oriented, having right-sided facial droop as well as slurred speech.  Eyes: His pupils were equal round and reactive, extraocular movement was intact, no sclera icterus. Dry oral mucosa  Neck: Neck is supple symmetrical no jugular vein distention or carotid bruits  Cardiovascular: Regular rate and rhythm normal S1-S2 no murmurs rubs or gallops. No extremity edema  Respiratory: Lungs are clear to auscultation bilaterally no wheezing rhonchi or rales  Abdomen: Soft nontender nondistended positive bowel sounds in upper quadrant  Skin: No rashes or lesions noted  Musculoskeletal:  Present range of motion to all extremities, no edema  Psychiatric: Patient is  awake alert and oriented x3, responding to questions appropriately.  Neurologic: Patient having facial droop to right side, slurred speech, however was able to respond to questions in a properly. Reports having some mild numbness to his lower lip, having 5 out of 5 muscle strength to bilateral upper extremities as well as lower extremities. Finger to nose was intact, no alteration to sensation to extremities.   Labs on Admission:  Basic Metabolic Panel:  Recent Labs Lab 10/02/13 1357  NA 141  K 4.8  CL 106  CO2 25  GLUCOSE 94  BUN 24*  CREATININE 1.14  CALCIUM 9.1    Liver Function Tests:  Recent Labs Lab 10/02/13 1357  AST 21  ALT 16  ALKPHOS 94  BILITOT 0.5  PROT 6.7  ALBUMIN 3.5   No results found for this basename: LIPASE, AMYLASE,  in the last 168 hours No results found for this basename: AMMONIA,  in the last 168 hours CBC:  Recent Labs Lab 10/02/13 1357  WBC 5.6  NEUTROABS 3.4  HGB 14.0  HCT 40.5  MCV 90.6  PLT 165   Cardiac Enzymes: No results found for this basename: CKTOTAL, CKMB, CKMBINDEX, TROPONINI,  in the last 168 hours  BNP (last 3 results) No results found for this basename: PROBNP,  in the last 8760 hours CBG:  Recent Labs Lab 10/02/13 1321  GLUCAP 89    Radiological Exams on Admission: Ct Head Wo Contrast  10/02/2013   CLINICAL DATA:  Significant headache  EXAM: CT HEAD WITHOUT CONTRAST  TECHNIQUE: Contiguous axial images were obtained from the base of the skull through the vertex without intravenous contrast.  COMPARISON:  None.  FINDINGS: No skull fracture is noted. No intracranial hemorrhage, mass effect or midline shift. Mild nodular mucosal thickening left maxillary sinus probable mucous retention cysts. Mastoid air cells are unremarkable. Mild cerebral atrophy. Mild periventricular and patchy subcortical white matter decreased attenuation probable due to chronic small vessel ischemic changes. No acute cortical infarction. No mass lesion is noted on this unenhanced scan.  IMPRESSION: No acute intracranial abnormality. Mild cerebral atrophy. Mild periventricular and patchy subcortical white matter decreased attenuation probable due to chronic small vessel ischemic changes.   Electronically Signed   By: Natasha Mead M.D.   On: 10/02/2013 13:54    EKG: Independently reviewed. EKG showing sinus rhythm  Assessment/Plan Active Problems:   CVA (cerebral infarction)   Malignant neoplasm of prostate   Hypothyroidism   1. Probable CVA. Patient presenting with complaints of slurred speech and facial droop, last  seen in his usual state health at 11 PM yesterday. Symptoms persist while in emergent apartment. He was administered 325 mg of aspirin. Initial CT scan of brain showed no acute intracranial abnormalities. He will be placed on the stroke protocol, continue antiplatelet therapy with aspirin 305 mg by mouth daily, adding Lipitor 40 mg by mouth daily. Will order MRI of brain, carotid Dopplers, transthoracic echocardiogram. Physical therapy occupational therapy and speech pathology consult. Neurology has been consulted, case discussed with Dr.Camilo.  2. Hypothyroidism. We'll check a TSH we will continue his home dose of Synthroid 50 mcg by mouth daily. 3. History of prostate cancer, castration resistant disease. He [facility] Dr Clelia Croft [facility]. Status post radical prostatectomy, with adjuvant radiation therapy in 1997. Patient having bone scan performed in 2013,  Oncology recommending observation and surveillance. 4. DVT prophylaxis. Lovenox    Code Status: Full Code Disposition Plan: Admit patient to telemetry, CVA protocol. Neurology consultation.  Time spent: 70 minutes  Jeralyn Bennett [facility] Pager [phone]  If 7PM-7AM, please contact night-coverage [email or web address] Password [facility] 10/02/2013, 4:23 PM

## 2013-10-02 NOTE — ED Notes (Signed)
Patient transported to CT 

## 2013-10-02 NOTE — ED Notes (Signed)
Internal medicine MD at bedside

## 2013-10-03 DIAGNOSIS — I359 Nonrheumatic aortic valve disorder, unspecified: Secondary | ICD-10-CM

## 2013-10-03 HISTORY — PX: TRANSTHORACIC ECHOCARDIOGRAM: SHX275

## 2013-10-03 LAB — LIPID PANEL
HDL: 35 mg/dL — ABNORMAL LOW (ref >39)
LDL Cholesterol: 92 mg/dL (ref 0–99)
Total CHOL/HDL Ratio: 4.6 RATIO
VLDL: 35 mg/dL (ref 0–40)

## 2013-10-03 LAB — CBC
MCH: 30.9 pg (ref 26.0–34.0)
MCV: 91.1 fL (ref 78.0–100.0)
Platelets: 162 10*3/uL (ref 150–400)
RBC: 4.27 MIL/uL (ref 4.22–5.81)
RDW: 13.3 % (ref 11.5–15.5)
WBC: 8.3 10*3/uL (ref 4.0–10.5)

## 2013-10-03 LAB — BASIC METABOLIC PANEL
BUN: 26 mg/dL — ABNORMAL HIGH (ref 6–23)
CO2: 26 mEq/L (ref 19–32)
Calcium: 9 mg/dL (ref 8.4–10.5)
Chloride: 106 mEq/L (ref 96–112)
Creatinine, Ser: 1.21 mg/dL (ref 0.50–1.35)
GFR calc non Af Amer: 54 mL/min — ABNORMAL LOW (ref >90)
Glucose, Bld: 95 mg/dL (ref 70–99)
Sodium: 141 mEq/L (ref 135–145)

## 2013-10-03 LAB — TSH: TSH: 1.685 u[IU]/mL (ref 0.350–4.500)

## 2013-10-03 LAB — HEMOGLOBIN A1C: Mean Plasma Glucose: 114 mg/dL (ref <117)

## 2013-10-03 MED ORDER — CLOPIDOGREL BISULFATE 75 MG PO TABS
75.0000 mg | ORAL_TABLET | Freq: Every day | ORAL | Status: DC
  Administered 2013-10-03 – 2013-10-04 (×2): 75 mg via ORAL
  Filled 2013-10-03 (×3): qty 1

## 2013-10-03 MED ORDER — INFLUENZA VAC SPLIT QUAD 0.5 ML IM SUSP
0.5000 mL | INTRAMUSCULAR | Status: AC
  Administered 2013-10-04: 0.5 mL via INTRAMUSCULAR
  Filled 2013-10-03: qty 0.5

## 2013-10-03 NOTE — Progress Notes (Signed)
*  PRELIMINARY RESULTS* Vascular Ultrasound Carotid Duplex (Doppler) has been completed.  Preliminary findings: Bilateral:  1-39% ICA stenosis.  Vertebral artery flow is antegrade.      Farrel Demark, RDMS, RVT  10/03/2013, 11:57 AM

## 2013-10-03 NOTE — Evaluation (Signed)
Physical Therapy Evaluation Patient Details Name: JOSHAU CODE MRN: [id number] DOB: [birth year] Today's Date: 10/03/2013 Time: 4098-1191 PT Time Calculation (min): 41 min  PT Assessment / Plan / Recommendation History of Present Illness  79 y.o. male who has history of prostate cancer and hypothyroidism. Last night at approximately 2300 hours he noted his speech was slurred and he had a left facial droop and slight feeling of being off balance but not listing or leaning to one side. Patient did not seek medical attention at that time. Upon waking his symptoms had not fully cleared and wife became worried he may have had a stroke. Patient was brought to ED for further evaluation. Initial CT head was negative for acute CVA but follow up MRI revealed a right parietal and cortical infarct in the right MCA distribution. Patient was not a tPA candidate due to LSN >8 hours prior  Clinical Impression  Patient demonstrates deficits in functional mobility as indicated below. Patient will benefit from continued skilled PT to address deficits and maximize function. Spoke with patient and family at length regarding concerns for mobility, discharge recommendations as well as 'BE FAST' stroke education. Family and patient receptive and appreciative. Will continue to see patient as indicated and progress activity as tolerated. Recommend HHPT for mobility and safety upon discharge with 24/ hour supervision.      PT Assessment  Patient needs continued PT services    Follow Up Recommendations  Home health PT;Supervision/Assistance - 24 hour    Does the patient have the potential to tolerate intense rehabilitation      Barriers to Discharge        Equipment Recommendations  Rolling walker with 5" wheels    Recommendations for Other Services     Frequency Min 4X/week    Precautions / Restrictions Precautions Precautions: Fall   Pertinent Vitals/Pain No pain at this time, VSS      Mobility  Bed  Mobility Bed Mobility: Not assessed Transfers Transfers: Sit to Stand;Stand to Sit Sit to Stand: 4: Min guard Stand to Sit: 4: Min assist Ambulation/Gait Ambulation/Gait Assistance: 4: Min guard Ambulation Distance (Feet): 400 Feet Assistive device: Rolling walker;None Ambulation/Gait Assistance Details: some modest instability with ambulation, shuffling LLE  Gait Pattern: Step-through pattern;Shuffle;Trunk flexed Gait velocity: decreased General Gait Details: instability noted without device, improved with use of RW Modified Rankin (Stroke Patients Only) Pre-Morbid Rankin Score: No significant disability Modified Rankin: Moderately severe disability        PT Diagnosis: Difficulty walking;Abnormality of gait  PT Problem List: Decreased balance;Decreased mobility;Decreased knowledge of use of DME PT Treatment Interventions: DME instruction;Gait training;Stair training;Functional mobility training;Therapeutic activities;Therapeutic exercise;Balance training;Patient/family education     PT Goals(Current goals can be found in the care plan section) Acute Rehab PT Goals Patient Stated Goal: to go home PT Goal Formulation: With patient/family Time For Goal Achievement: 10/17/13 Potential to Achieve Goals: Good  Visit Information  Last PT Received On: 10/03/13 Assistance Needed: +1 History of Present Illness: 79 y.o. male who has history of prostate cancer and hypothyroidism. Last night at approximately 2300 hours he noted his speech was slurred and he had a left facial droop and slight feeling of being off balance but not listing or leaning to one side. Patient did not seek medical attention at that time. Upon waking his symptoms had not fully cleared and wife became worried he may have had a stroke. Patient was brought to ED for further evaluation. Initial CT head was negative for acute CVA  but follow up MRI revealed a right parietal and cortical infarct in the right MCA distribution.  Patient was not a tPA candidate due to LSN >8 hours prior       Prior Functioning  Home Living Family/patient expects to be discharged to:: Private residence Living Arrangements: Spouse/significant other Available Help at Discharge: Family Type of Home: House Home Access: Stairs to enter Secretary/administrator of Steps: 1 Entrance Stairs-Rails: None Home Layout: One level Home Equipment: Cane - single point (not needed for ambulation) Prior Function Level of Independence: Independent Communication Communication: No difficulties Dominant Hand: Right    Cognition  Cognition Arousal/Alertness: Awake/alert Behavior During Therapy: WFL for tasks assessed/performed Overall Cognitive Status: Within Functional Limits for tasks assessed    Extremity/Trunk Assessment Upper Extremity Assessment Upper Extremity Assessment: Defer to OT evaluation Lower Extremity Assessment Lower Extremity Assessment: Overall WFL for tasks assessed   Balance Balance Balance Assessed: Yes Static Standing Balance Static Standing - Balance Support: During functional activity Static Standing - Level of Assistance: 5: Stand by assistance High Level Balance High Level Balance Activites: Side stepping;Backward walking;Direction changes;Turns High Level Balance Comments: min guard  End of Session PT - End of Session Equipment Utilized During Treatment: Gait belt Activity Tolerance: Patient tolerated treatment well Patient left: in chair;with call bell/phone within reach;with family/visitor present Nurse Communication: Mobility status  GP     Fabio Asa 10/03/2013, 1:54 PM Charlotte Crumb, PT DPT  [phone]

## 2013-10-03 NOTE — Progress Notes (Signed)
[facility] PROGRESS NOTE  Richard Jacobs:[id number] DOB: 02/03/1928 DOA: 10/02/2013 PCP: Minda Meo, MD  Assessment/Plan: 1. Acute right frontal and parietal lobe nonhemorrhagic infarct- patient has been seen by stroke team and at this time he will be started on dual platelet therapy including full dose aspirin and Plavix 75 mg for 90 days. After 90 days Plavix can be discontinued and aspirin should be continued. Patient will get physical therapy evaluation. We'll await 2-D echo and carotid Doppler results.  2. Scapular metastasis- patient has history of prostate cancer and is being followed by oncology at [facility]. The chest x-ray shows interval sclerotic scapular metastasis with most likely cause as prostate cancer. Patient will follow up with his oncologist for further workup including bone scan and PET scan.  3. Hypothyroidism-patient's TSH is 1.685, continue with Synthroid 50 mcg by mouth daily.  4. DVT prophylaxis-Lovenox    Code Status: Full code Family Communication: *Discussed with wife at the bedside Disposition Plan: *To be determined   Consultants:  Neurology  Procedures:  Echo   Carotid doppler  Antibiotics:  None  HPI/Subjective: Patient admitted last night with slurred speech, CT head was negative but the MRI showed acute nonhemorrhagic infarct involving portions of right frontal and parietal lobe. Patient has history of prostate cancer, his chest x-ray shows interval sclerotic scapular metastasis. Patient still continues to have slurred speech.  Objective: Filed Vitals:   10/03/13 1040  BP: 123/62  Pulse: 76  Temp: 98 F (36.7 C)  Resp: 20    Intake/Output Summary (Last 24 hours) at 10/03/13 1319 Last data filed at 10/03/13 0800  Gross per 24 hour  Intake    480 ml  Output      2 ml  Net    478 ml   Filed Weights   10/02/13 2126  Weight: 95.1 kg (209 lb 10.5 oz)    Exam:  Physical Exam: Head: Normocephalic, atraumatic.   Eyes: No signs of jaundice, EOMI Nose: Mucous membranes dry.  Throat: Oropharynx nonerythematous, no exudate appreciated.  Neck: supple,No deformities, masses, or tenderness noted. Lungs: Normal respiratory effort. B/L Clear to auscultation, no crackles or wheezes.  Heart: Regular RR. S1 and S2 normal  Abdomen: BS normoactive. Soft, Nondistended, non-tender.  Extremities: No pretibial edema, no erythema Neuro- speech dysarthria, motor strength is intact bilaterally  Data Reviewed: Basic Metabolic Panel:  Recent Labs Lab 10/02/13 1357 10/02/13 2115 10/03/13 0447  NA 141  --  141  K 4.8  --  4.2  CL 106  --  106  CO2 25  --  26  GLUCOSE 94  --  95  BUN 24*  --  26*  CREATININE 1.14 1.29 1.21  CALCIUM 9.1  --  9.0   Liver Function Tests:  Recent Labs Lab 10/02/13 1357  AST 21  ALT 16  ALKPHOS 94  BILITOT 0.5  PROT 6.7  ALBUMIN 3.5   No results found for this basename: LIPASE, AMYLASE,  in the last 168 hours No results found for this basename: AMMONIA,  in the last 168 hours CBC:  Recent Labs Lab 10/02/13 1357 10/02/13 2115 10/03/13 0447  WBC 5.6 9.9 8.3  NEUTROABS 3.4  --   --   HGB 14.0 14.7 13.2  HCT 40.5 42.3 38.9*  MCV 90.6 91.0 91.1  PLT 165 179 162   Cardiac Enzymes: No results found for this basename: CKTOTAL, CKMB, CKMBINDEX, TROPONINI,  in the last 168 hours BNP (last 3 results) No results found  for this basename: PROBNP,  in the last 8760 hours CBG:  Recent Labs Lab 10/02/13 1321  GLUCAP 89    No results found for this or any previous visit (from the past 240 hour(s)).   Studies: Dg Chest 2 View  10/02/2013   CLINICAL DATA:  Stroke. Prostate cancer. Increased activity in the left shoulder in the scapular region on a previous nuclear medicine bone scan.  EXAM: CHEST  2 VIEW  COMPARISON:  01/28/2004. Nuclear medicine bone scan dated 04/25/2012.  FINDINGS: Poor inspiration. Borderline enlarged cardiac silhouette. Clear lungs. Tortuous  aorta. Increased prominence of the superior mediastinum. Unremarkable bones.  IMPRESSION: 1. Increased prominence of the superior mediastinum, concerning for possible adenopathy. 2. Interval sclerotic scapular metastasis. .   Electronically Signed   By: Gordan Payment M.D.   On: 10/02/2013 22:04   Ct Head Wo Contrast  10/02/2013   CLINICAL DATA:  Significant headache  EXAM: CT HEAD WITHOUT CONTRAST  TECHNIQUE: Contiguous axial images were obtained from the base of the skull through the vertex without intravenous contrast.  COMPARISON:  None.  FINDINGS: No skull fracture is noted. No intracranial hemorrhage, mass effect or midline shift. Mild nodular mucosal thickening left maxillary sinus probable mucous retention cysts. Mastoid air cells are unremarkable. Mild cerebral atrophy. Mild periventricular and patchy subcortical white matter decreased attenuation probable due to chronic small vessel ischemic changes. No acute cortical infarction. No mass lesion is noted on this unenhanced scan.  IMPRESSION: No acute intracranial abnormality. Mild cerebral atrophy. Mild periventricular and patchy subcortical white matter decreased attenuation probable due to chronic small vessel ischemic changes.   Electronically Signed   By: Natasha Mead M.D.   On: 10/02/2013 13:54   Mr Maxine Glenn Head Wo Contrast  10/02/2013   CLINICAL DATA:  Slurred speech and facial droop.  EXAM: MRI HEAD WITHOUT CONTRAST  MRA HEAD WITHOUT CONTRAST  TECHNIQUE: Multiplanar, multiecho pulse sequences of the brain and surrounding structures were obtained without intravenous contrast. Angiographic images of the head were obtained using MRA technique without contrast.  COMPARISON:  10/02/2013 head CT.  FINDINGS: MRI HEAD FINDINGS  Acute nonhemorrhagic infarct involving portions of the right frontal and parietal lobe.  No intracranial hemorrhage.  Small vessel disease type changes.  Atrophy without hydrocephalus.  No intracranial mass lesion noted on this  unenhanced exam.  Mild transverse ligament hypertrophy. Cervical medullary junction, pituitary region, pineal region and orbital structures unremarkable.  Mucosal thickening paranasal sinuses polypoid along the inferior aspect of the maxillary sinuses.  MRA HEAD FINDINGS  Anterior circulation without medium or large size vessel significant stenosis or occlusion.  Moderate narrowing distal right vertebral artery. Mild narrowing distal left vertebral artery.  Non visualization left posterior inferior cerebellar artery and right anterior inferior cerebellar artery.  No high-grade stenosis of the basilar artery.  Moderate narrowing left and mild narrowing right superior cerebral artery.  No aneurysm noted.  IMPRESSION: Acute nonhemorrhagic infarct involving portions of the right frontal and parietal lobe.  Please see above.  These results will be called to the ordering clinician or representative by the Radiologist Assistant, and communication documented in the PACS Dashboard.   Electronically Signed   By: Bridgett Larsson M.D.   On: 10/02/2013 18:13   Mr Brain Wo Contrast  10/02/2013   CLINICAL DATA:  Slurred speech and facial droop.  EXAM: MRI HEAD WITHOUT CONTRAST  MRA HEAD WITHOUT CONTRAST  TECHNIQUE: Multiplanar, multiecho pulse sequences of the brain and surrounding structures were obtained without intravenous  contrast. Angiographic images of the head were obtained using MRA technique without contrast.  COMPARISON:  10/02/2013 head CT.  FINDINGS: MRI HEAD FINDINGS  Acute nonhemorrhagic infarct involving portions of the right frontal and parietal lobe.  No intracranial hemorrhage.  Small vessel disease type changes.  Atrophy without hydrocephalus.  No intracranial mass lesion noted on this unenhanced exam.  Mild transverse ligament hypertrophy. Cervical medullary junction, pituitary region, pineal region and orbital structures unremarkable.  Mucosal thickening paranasal sinuses polypoid along the inferior aspect of  the maxillary sinuses.  MRA HEAD FINDINGS  Anterior circulation without medium or large size vessel significant stenosis or occlusion.  Moderate narrowing distal right vertebral artery. Mild narrowing distal left vertebral artery.  Non visualization left posterior inferior cerebellar artery and right anterior inferior cerebellar artery.  No high-grade stenosis of the basilar artery.  Moderate narrowing left and mild narrowing right superior cerebral artery.  No aneurysm noted.  IMPRESSION: Acute nonhemorrhagic infarct involving portions of the right frontal and parietal lobe.  Please see above.  These results will be called to the ordering clinician or representative by the Radiologist Assistant, and communication documented in the PACS Dashboard.   Electronically Signed   By: Bridgett Larsson M.D.   On: 10/02/2013 18:13    Scheduled Meds: . aspirin  300 mg Rectal Daily   Or  . aspirin  325 mg Oral Daily  . atorvastatin  40 mg Oral q1800  . enoxaparin (LOVENOX) injection  40 mg Subcutaneous Q24H  . [START ON 10/04/2013] influenza vac split quadrivalent PF  0.5 mL Intramuscular Tomorrow-1000  . levothyroxine  50 mcg Oral QAC breakfast   Continuous Infusions:   Active Problems:   Malignant neoplasm of prostate   CVA (cerebral infarction)   Hypothyroidism    Time spent: 30 min    [facility]  [facility] Pager [phone]. If 7PM-7AM, please contact night-coverage at [email or web address], password [facility] 10/03/2013, 1:19 PM  LOS: 1 day

## 2013-10-03 NOTE — Evaluation (Signed)
Speech Language Pathology Evaluation Patient Details Name: Richard Jacobs MRN: [id number] DOB: [birth year] Today's Date: 10/03/2013 Time: 4098-1191 SLP Time Calculation (min): 26 min  Problem List:  Patient Active Problem List   Diagnosis Date Noted  . CVA (cerebral infarction) 10/02/2013  . Hypothyroidism 10/02/2013  . Malignant neoplasm of prostate 04/17/2012   Past Medical History:  Past Medical History  Diagnosis Date  . Thyroid disease   . Cancer     prostate  . Hypothyroidism    Past Surgical History: History reviewed. No pertinent past surgical history. HPI:  Richard Jacobs is a 79 y.o. male with a past medical history of hypothyroidism and prostate cancer, presenting with complaints of slurred speech. He reports being in his usual state health, woke up this morning, family members noted him to have slurred speech as well as a right facial droop. Patient was last seen in his usual state health at 11:00 last night. He reported having associated lower lip numbness as well as being "off balance" however did not have any falls. He denies extremity weakness, or alteration to sensation of his extremities, denied history of CVA. Symptoms persisted through the day and while in the emergency department. CT scan of brain without contrast performed in the ED showing no acute intracranial abnormalities. He had not been on aspirin therapy prior to this presentation denied a history of CVA.  He was given 25 mg of aspirin in the emergency department. He currently denies nausea vomiting fevers chills chest pain shortness of breath.     Assessment / Plan / Recommendation Clinical Impression  Pt's cognition and language skills appear withing functional limits for tasks assessed- memory, functional problem solving, following directions, attention, and verbal expression at the conversation level. Pt does demonstrate a mild dysarthria but is 95-100% intelligible. Pt already speaks at a slow rate  and good volume. He is aware of his speech not sounding the same as prior to admission. Discussed potential for o/p speech therapy if needed. Pt is not demonstrating any acute speech needs but may benefit from o/p speech therapy at discharge to increase intelligibility and quality of life. Speech will sign off at this time; please re-consult if needed.     SLP Assessment  All further Speech Lanaguage Pathology  needs can be addressed in the next venue of care    Follow Up Recommendations  Home health SLP;Outpatient SLP    Frequency and Duration        Pertinent Vitals/Pain n/a   SLP Goals  SLP Goals Progress/Goals/Alternative treatment plan discussed with pt/caregiver and they: Agree  SLP Evaluation Prior Functioning  Cognitive/Linguistic Baseline: Within functional limits Type of Home: House  Lives With: Spouse Available Help at Discharge: Family Vocation: Retired   IT consultant  Overall Cognitive Status: Within Functional Limits for tasks assessed Arousal/Alertness: Awake/alert Orientation Level: Oriented X4 Attention: Selective;Alternating Selective Attention: Appears intact Alternating Attention: Appears intact Memory: Appears intact Awareness: Appears intact Problem Solving: Appears intact Executive Function: Decision Making;Reasoning Reasoning: Appears intact Decision Making: Appears intact Safety/Judgment: Appears intact    Comprehension  Auditory Comprehension Overall Auditory Comprehension: Appears within functional limits for tasks assessed Yes/No Questions: Within Functional Limits Commands: Within Functional Limits Conversation: Complex    Expression Expression Primary Mode of Expression: Verbal Verbal Expression Overall Verbal Expression: Appears within functional limits for tasks assessed Level of Generative/Spontaneous Verbalization: Sentence Naming: No impairment Pragmatics: No impairment Non-Verbal Means of Communication: Not applicable Written  Expression Dominant Hand: Right   Oral /  Motor Oral Motor/Sensory Function Overall Oral Motor/Sensory Function: Impaired Labial ROM: Reduced left Labial Symmetry: Abnormal symmetry left Labial Strength: Reduced Lingual ROM: Within Functional Limits Lingual Symmetry: Within Functional Limits Lingual Strength: Within Functional Limits Motor Speech Overall Motor Speech: Impaired Respiration: Within functional limits Phonation: Normal Resonance: Within functional limits Articulation: Impaired Level of Impairment: Conversation Intelligibility: Intelligible Motor Planning: Witnin functional limits Motor Speech Errors: Aware Effective Techniques: Slow rate;Over-articulate (Pt already using these strategies)   GO     Metro Kung, MA, CCC-SLP 10/03/2013, 3:35 PM

## 2013-10-03 NOTE — Progress Notes (Signed)
  Echocardiogram 2D Echocardiogram has been performed.  Richard Jacobs, Richard Jacobs 10/03/2013, 5:57 PM

## 2013-10-03 NOTE — Progress Notes (Signed)
Stroke Team Progress Note  HISTORY 79 y.o. male who has history of prostate cancer and hypothyroidism. Last night at approximately 2300 hours he noted his speech was slurred and he had a left facial droop and slight feeling of being off balance but not listing or leaning to one side. Patient did not seek medical attention at that time. Upon waking his symptoms had not fully cleared and wife became worried he may have had a stroke. Patient was brought to ED for further evaluation. Initial CT head was negative for acute CVA but follow up MRI revealed a right parietal and cortical infarct in the right MCA distribution. Patient was not a tPA candidate due to LSN >8 hours prior.    Patient was not a TPA candidate secondary to outside TPA window  SUBJECTIVE His speech has improved.   OBJECTIVE Most recent Vital Signs: Filed Vitals:   10/03/13 0400 10/03/13 0600 10/03/13 0753 10/03/13 1040  BP: 144/83 139/80 133/67 123/62  Pulse: 70 74 73 76  Temp: 98.7 F (37.1 C) 98.1 F (36.7 C) 98.8 F (37.1 C) 98 F (36.7 C)  TempSrc: Oral Oral Oral Oral  Resp: 18 18 20 20   Height:      Weight:      SpO2: 96% 97% 95% 96%   CBG (last 3)   Recent Labs  10/02/13 1321  GLUCAP 89    IV Fluid Intake:     MEDICATIONS  . aspirin  300 mg Rectal Daily   Or  . aspirin  325 mg Oral Daily  . atorvastatin  40 mg Oral q1800  . enoxaparin (LOVENOX) injection  40 mg Subcutaneous Q24H  . levothyroxine  50 mcg Oral QAC breakfast   PRN:  acetaminophen, oxyCODONE, senna-docusate  Diet:  General  Activity Up with assistance DVT Prophylaxis:  scds  CLINICALLY SIGNIFICANT STUDIES Basic Metabolic Panel:  Recent Labs Lab 10/02/13 1357 10/02/13 2115 10/03/13 0447  NA 141  --  141  K 4.8  --  4.2  CL 106  --  106  CO2 25  --  26  GLUCOSE 94  --  95  BUN 24*  --  26*  CREATININE 1.14 1.29 1.21  CALCIUM 9.1  --  9.0   Liver Function Tests:  Recent Labs Lab 10/02/13 1357  AST 21  ALT 16   ALKPHOS 94  BILITOT 0.5  PROT 6.7  ALBUMIN 3.5   CBC:  Recent Labs Lab 10/02/13 1357 10/02/13 2115 10/03/13 0447  WBC 5.6 9.9 8.3  NEUTROABS 3.4  --   --   HGB 14.0 14.7 13.2  HCT 40.5 42.3 38.9*  MCV 90.6 91.0 91.1  PLT 165 179 162   Coagulation:  Recent Labs Lab 10/02/13 1357  LABPROT 13.3  INR 1.03   Cardiac Enzymes: No results found for this basename: CKTOTAL, CKMB, CKMBINDEX, TROPONINI,  in the last 168 hours Urinalysis:  Recent Labs Lab 10/02/13 1400  COLORURINE YELLOW  LABSPEC 1.016  PHURINE 5.0  GLUCOSEU NEGATIVE  HGBUR NEGATIVE  BILIRUBINUR NEGATIVE  KETONESUR NEGATIVE  PROTEINUR NEGATIVE  UROBILINOGEN 0.2  NITRITE NEGATIVE  LEUKOCYTESUR NEGATIVE   Lipid Panel    Component Value Date/Time   CHOL 162 10/03/2013 0447   TRIG 173* 10/03/2013 0447   HDL 35* 10/03/2013 0447   CHOLHDL 4.6 10/03/2013 0447   VLDL 35 10/03/2013 0447   LDLCALC 92 10/03/2013 0447   HgbA1C  No results found for this basename: HGBA1C    Urine Drug Screen:  Component Value Date/Time   LABOPIA NONE DETECTED 10/02/2013 1400   COCAINSCRNUR NONE DETECTED 10/02/2013 1400   LABBENZ NONE DETECTED 10/02/2013 1400   AMPHETMU NONE DETECTED 10/02/2013 1400   THCU NONE DETECTED 10/02/2013 1400   LABBARB NONE DETECTED 10/02/2013 1400    Alcohol Level:  Recent Labs Lab 10/02/13 1357  ETH <11    Dg Chest 2 View  10/02/2013   CLINICAL DATA:  Stroke. Prostate cancer. Increased activity in the left shoulder in the scapular region on a previous nuclear medicine bone scan.  EXAM: CHEST  2 VIEW  COMPARISON:  01/28/2004. Nuclear medicine bone scan dated 04/25/2012.  FINDINGS: Poor inspiration. Borderline enlarged cardiac silhouette. Clear lungs. Tortuous aorta. Increased prominence of the superior mediastinum. Unremarkable bones.  IMPRESSION: 1. Increased prominence of the superior mediastinum, concerning for possible adenopathy. 2. Interval sclerotic scapular metastasis. .   Electronically  Signed   By: Gordan Payment M.D.   On: 10/02/2013 22:04   Ct Head Wo Contrast  10/02/2013   CLINICAL DATA:  Significant headache  EXAM: CT HEAD WITHOUT CONTRAST  TECHNIQUE: Contiguous axial images were obtained from the base of the skull through the vertex without intravenous contrast.  COMPARISON:  None.  FINDINGS: No skull fracture is noted. No intracranial hemorrhage, mass effect or midline shift. Mild nodular mucosal thickening left maxillary sinus probable mucous retention cysts. Mastoid air cells are unremarkable. Mild cerebral atrophy. Mild periventricular and patchy subcortical white matter decreased attenuation probable due to chronic small vessel ischemic changes. No acute cortical infarction. No mass lesion is noted on this unenhanced scan.  IMPRESSION: No acute intracranial abnormality. Mild cerebral atrophy. Mild periventricular and patchy subcortical white matter decreased attenuation probable due to chronic small vessel ischemic changes.   Electronically Signed   By: Natasha Mead M.D.   On: 10/02/2013 13:54   Mr Richard Jacobs Head Wo Contrast  10/02/2013   CLINICAL DATA:  Slurred speech and facial droop.  EXAM: MRI HEAD WITHOUT CONTRAST  MRA HEAD WITHOUT CONTRAST  TECHNIQUE: Multiplanar, multiecho pulse sequences of the brain and surrounding structures were obtained without intravenous contrast. Angiographic images of the head were obtained using MRA technique without contrast.  COMPARISON:  10/02/2013 head CT.  FINDINGS: MRI HEAD FINDINGS  Acute nonhemorrhagic infarct involving portions of the right frontal and parietal lobe.  No intracranial hemorrhage.  Small vessel disease type changes.  Atrophy without hydrocephalus.  No intracranial mass lesion noted on this unenhanced exam.  Mild transverse ligament hypertrophy. Cervical medullary junction, pituitary region, pineal region and orbital structures unremarkable.  Mucosal thickening paranasal sinuses polypoid along the inferior aspect of the maxillary  sinuses.  MRA HEAD FINDINGS  Anterior circulation without medium or large size vessel significant stenosis or occlusion.  Moderate narrowing distal right vertebral artery. Mild narrowing distal left vertebral artery.  Non visualization left posterior inferior cerebellar artery and right anterior inferior cerebellar artery.  No high-grade stenosis of the basilar artery.  Moderate narrowing left and mild narrowing right superior cerebral artery.  No aneurysm noted.  IMPRESSION: Acute nonhemorrhagic infarct involving portions of the right frontal and parietal lobe.  Please see above.  These results will be called to the ordering clinician or representative by the Radiologist Assistant, and communication documented in the PACS Dashboard.   Electronically Signed   By: Bridgett Larsson M.D.   On: 10/02/2013 18:13   Mr Brain Wo Contrast  10/02/2013   CLINICAL DATA:  Slurred speech and facial droop.  EXAM: MRI HEAD WITHOUT  CONTRAST  MRA HEAD WITHOUT CONTRAST  TECHNIQUE: Multiplanar, multiecho pulse sequences of the brain and surrounding structures were obtained without intravenous contrast. Angiographic images of the head were obtained using MRA technique without contrast.  COMPARISON:  10/02/2013 head CT.  FINDINGS: MRI HEAD FINDINGS  Acute nonhemorrhagic infarct involving portions of the right frontal and parietal lobe.  No intracranial hemorrhage.  Small vessel disease type changes.  Atrophy without hydrocephalus.  No intracranial mass lesion noted on this unenhanced exam.  Mild transverse ligament hypertrophy. Cervical medullary junction, pituitary region, pineal region and orbital structures unremarkable.  Mucosal thickening paranasal sinuses polypoid along the inferior aspect of the maxillary sinuses.  MRA HEAD FINDINGS  Anterior circulation without medium or large size vessel significant stenosis or occlusion.  Moderate narrowing distal right vertebral artery. Mild narrowing distal left vertebral artery.  Non  visualization left posterior inferior cerebellar artery and right anterior inferior cerebellar artery.  No high-grade stenosis of the basilar artery.  Moderate narrowing left and mild narrowing right superior cerebral artery.  No aneurysm noted.  IMPRESSION: Acute nonhemorrhagic infarct involving portions of the right frontal and parietal lobe.  Please see above.  These results will be called to the ordering clinician or representative by the Radiologist Assistant, and communication documented in the PACS Dashboard.   Electronically Signed   By: Bridgett Larsson M.D.   On: 10/02/2013 18:13    CT of the brain  No acute intracranial abnormality  MRI of the brain  Acute nonhemorrhagic infarct involving portions of the right frontal  and parietal lobe.  MRA of the brain   Moderate narrowing distal right vertebral artery. Mild narrowing distal left vertebral artery.  Non visualization left posterior inferior cerebellar artery and right anterior inferior cerebellar artery.  No high-grade stenosis of the basilar artery.   2D Echocardiogram    Carotid Doppler    CXR  1. Increased prominence of the superior mediastinum, concerning for  possible adenopathy.  2. Interval sclerotic scapular metastasis.   EKG sinus  Therapy Recommendations pending  Physical Exam  Blood pressure 136/72, pulse 61, temperature 98.9 F (37.2 C), temperature source Oral, resp. rate 17, SpO2 98.00%.  Mental Status:  Alert, oriented, thought content appropriate. Speech dysarthric without evidence of aphasia. Able to follow 3 step commands without difficulty.  Cranial Nerves:  II: Discs flat bilaterally; Visual fields grossly normal, pupils equal, round, reactive to light and accommodation  III,IV, VI: ptosis not present, extra-ocular motions intact bilaterally  V,VII: smile symmetric, facial light touch sensation normal bilaterally  VIII: hearing normal bilaterally  IX,X: gag reflex present  XI: bilateral shoulder shrug   XII: midline tongue extension without atrophy or fasciculations  Motor:  Right : Upper extremity 5/5 Left: Upper extremity 5/5  Lower extremity 5/5 Lower extremity 4/5  Tone and bulk:normal tone throughout; no atrophy noted  Sensory: Pinprick and light touch intact throughout, bilaterally  Deep Tendon Reflexes:  Right: Upper Extremity Left: Upper extremity  biceps (C-5 to C-6) 2/4 biceps (C-5 to C-6) 2/4  tricep (C7) 2/4 triceps (C7) 2/4  Brachioradialis (C6) 2/4 Brachioradialis (C6) 2/4  Lower Extremity Lower Extremity  quadriceps (L-2 to L-4) 2/4 quadriceps (L-2 to L-4) 2/4  Achilles (S1) 2/4 Achilles (S1) 2/4  Plantars:  Right: downgoing Left: downgoing  Cerebellar:  normal finger-to-nose, normal heel-to-shin test  Gait: normal  CV: pulses palpable throughout    ASSESSMENT 79 y.o. male presenting with acute right parietal infarct suspicious for cardio embolic source. Outside TPA window.  As per family speech improved but still complaining of L sided weakness/numbness. Pt has hx of prostate CA diagnosed 15 yrs ago thought to be in remission but as per family PSA has recently been increasing. Significant stenosis in anterior and posterior circulation on MRA   Possibly hypercoagulable state   Hospital day # 1  TREATMENT/PLAN  Con't pt/ot  Speech eval  Due to high intravascular stenosis in anterior and posterior circulation please start pt on dual anti platelet use based on sammpris trial. Start ASA 325 and plavix 75 for 90 days. After 90 days d/c plavix  Out pt f/up with urology  Case d/w with pt's family members at bedside.    Pauletta Browns  10/03/2013 10:58 AM  I have personally obtained a history, examined the patient, evaluated imaging results, and formulated the assessment and plan of care. I agree with the above.

## 2013-10-04 LAB — LIPID PANEL: HDL: 39 mg/dL — ABNORMAL LOW (ref >39)

## 2013-10-04 MED ORDER — ASPIRIN 325 MG PO TABS: 325.0000 mg | ORAL_TABLET | Freq: Every day | ORAL | Status: DC

## 2013-10-04 MED ORDER — CLOPIDOGREL BISULFATE 75 MG PO TABS: 75.0000 mg | ORAL_TABLET | Freq: Every day | ORAL | Status: AC

## 2013-10-04 MED ORDER — ATORVASTATIN CALCIUM 20 MG PO TABS: 20.0000 mg | ORAL_TABLET | Freq: Every day | ORAL | Status: DC

## 2013-10-04 NOTE — Evaluation (Addendum)
Occupational Therapy Evaluation Patient Details Name: Richard Jacobs MRN: [id number] DOB: 07-28-1928 Today's Date: 10/04/2013 Time: 7829-5621 OT Time Calculation (min): 47 min  OT Assessment / Plan / Recommendation History of present illness 79 y.o. male who has history of prostate cancer and hypothyroidism. Last night at approximately 2300 hours he noted his speech was slurred and he had a left facial droop and slight feeling of being off balance but not listing or leaning to one side. Patient did not seek medical attention at that time. Upon waking his symptoms had not fully cleared and wife became worried he may have had a stroke. Patient was brought to ED for further evaluation. Initial CT head was negative for acute CVA but follow up MRI revealed a right parietal and cortical infarct in the right MCA distribution. Patient was not a tPA candidate due to LSN >8 hours prior   Clinical Impression   Pt presents with below problem list. Education provided to pt and spouse. Pt planning to d/c today, so all further OT needs can be met in next venue of care.     OT Assessment  All further OT needs can be met in the next venue of care    Follow Up Recommendations  Home health OT;Supervision/Assistance - 24 hour *Recommended NO driving until pt gets a Humphrey 120.3 full field peripheral assessment completed.    Barriers to Discharge      Equipment Recommendations  3 in 1 bedside comode    Recommendations for Other Services    Frequency       Precautions / Restrictions Precautions Precautions: Fall Precaution Comments: weakness/motor control deficit L LE, use RW, supervision   Pertinent Vitals/Pain No pain reported.     ADL  Grooming: Minimal assistance Where Assessed - Grooming: Supported sitting Upper Body Dressing: Minimal assistance Where Assessed - Upper Body Dressing: Supported sitting Lower Body Dressing: Minimal assistance Where Assessed - Lower Body Dressing: Supported  sit to stand Toilet Transfer: Hydrographic surveyor Method: Sit to Barista: Regular height toilet;Grab bars Tub/Shower Transfer Method: Not assessed Equipment Used: Gait belt;Rolling walker;Other (comment) (theraputty) Transfers/Ambulation Related to ADLs: Cues to slow down with ambulation-overall Min A for ambulation. Min guard for transfers. ADL Comments: OT educated on fine motor activities for Lt hand and had pt perform theraputty exercises. Encouraged pt to use Lt hand during activities. Educated on dressing technique. Educated to sit to bathe and for dressing. Pt practiced tying shoe (OT assisted) and also used Left hand to help don/doff sock. Educated wife to have rugs picked up (non skid in bathroom) and use of bag on walker.   Pt presented with vision deficits-OT recommended NO driving until he gets a Humphrey 120.3 full field peripheral assessment done. Educated for pt and wife not to attempt shower transfer until [facility] addresses this (wife unsure what will fit in shower).  Discussed use of elastic shoe laces, but then explained that it is beneficial for him to practice tying shoes.    OT Diagnosis:    OT Problem List: Decreased strength;Decreased activity tolerance;Impaired balance (sitting and/or standing);Decreased knowledge of use of DME or AE;Decreased knowledge of precautions;Impaired vision/perception;Decreased coordination;Impaired UE functional use;Impaired sensation OT Treatment Interventions:     OT Goals(Current goals can be found in the care plan section)    Visit Information  Last OT Received On: 10/04/13 Assistance Needed: +1 History of Present Illness: 79 y.o. male who has history of prostate cancer and hypothyroidism. Last night at  approximately 2300 hours he noted his speech was slurred and he had a left facial droop and slight feeling of being off balance but not listing or leaning to one side. Patient did not seek medical attention at that  time. Upon waking his symptoms had not fully cleared and wife became worried he may have had a stroke. Patient was brought to ED for further evaluation. Initial CT head was negative for acute CVA but follow up MRI revealed a right parietal and cortical infarct in the right MCA distribution. Patient was not a tPA candidate due to LSN >8 hours prior       Prior Functioning     Home Living Family/patient expects to be discharged to:: Private residence Living Arrangements: Spouse/significant other Available Help at Discharge: Family Type of Home: House Home Access: Stairs to enter Secretary/administrator of Steps: 1 Entrance Stairs-Rails: None Home Layout: One level Home Equipment: Medical laboratory scientific officer - single point  Lives With: Spouse Prior Function Level of Independence: Independent Communication Communication: No difficulties Dominant Hand: Right         Vision/Perception Vision - History Baseline Vision:  (wears glasses most of the time) Patient Visual Report: No change from baseline Vision - Assessment Vision Assessment: Vision tested Tracking/Visual Pursuits:  (difficult tracking at times in both left and right and sustaining in both left and right superior quadrants) Visual Fields:  (inconsistent on both sides-Left worse)   Cognition  Cognition Arousal/Alertness: Awake/alert Behavior During Therapy: WFL for tasks assessed/performed Overall Cognitive Status: Within Functional Limits for tasks assessed    Extremity/Trunk Assessment Upper Extremity Assessment Upper Extremity Assessment: LUE deficits/detail LUE Deficits / Details: Weakness in LUE compared to Rt. LUE Sensation: decreased light touch Lower Extremity Assessment Lower Extremity Assessment: Defer to PT evaluation     Mobility Transfers Transfers: Sit to Stand;Stand to Sit Sit to Stand: 4: Min guard;With upper extremity assist;From chair/3-in-1;From toilet Stand to Sit: 4: Min guard;With upper extremity assist;To  chair/3-in-1;To toilet Details for Transfer Assistance: Cues for technique.     Exercise     Balance     End of Session OT - End of Session Equipment Utilized During Treatment: Gait belt;Rolling walker Activity Tolerance: Patient tolerated treatment well Patient left: in chair;with call bell/phone within reach;with family/visitor present Nurse Communication: Other (comment) (need HHOT)  GO     Earlie Raveling OTR/L [phone] 10/04/2013, 5:26 PM

## 2013-10-04 NOTE — Progress Notes (Signed)
Stroke Team Progress Note  HISTORY 79 y.o. male who has history of prostate cancer and hypothyroidism. Last night at approximately 2300 hours he noted his speech was slurred and he had a left facial droop and slight feeling of being off balance but not listing or leaning to one side. Patient did not seek medical attention at that time. Upon waking his symptoms had not fully cleared and wife became worried he may have had a stroke. Patient was brought to ED for further evaluation. Initial CT head was negative for acute CVA but follow up MRI revealed a right parietal and cortical infarct in the right MCA distribution. Patient was not a tPA candidate due to LSN >8 hours prior.   Patient was not a TPA candidate secondary to outside TPA window  SUBJECTIVE Multiple family members present. The patient is without complaints; although he had many questions regarding his stroke all of which were answered.  OBJECTIVE Most recent Vital Signs: Filed Vitals:   10/03/13 2100 10/04/13 0100 10/04/13 0503 10/04/13 0911  BP: 157/71 156/82 150/64 120/65  Pulse: 75 75 77 83  Temp: 98 F (36.7 C) 98.2 F (36.8 C) 98.1 F (36.7 C) 98.2 F (36.8 C)  TempSrc: Oral Oral Oral Oral  Resp: 18 18 19 18   Height:      Weight:      SpO2: 96% 96% 97% 96%   CBG (last 3)   Recent Labs  10/02/13 1321  GLUCAP 89    IV Fluid Intake:     MEDICATIONS  . aspirin  300 mg Rectal Daily   Or  . aspirin  325 mg Oral Daily  . atorvastatin  40 mg Oral q1800  . clopidogrel  75 mg Oral Q breakfast  . enoxaparin (LOVENOX) injection  40 mg Subcutaneous Q24H  . influenza vac split quadrivalent PF  0.5 mL Intramuscular Tomorrow-1000  . levothyroxine  50 mcg Oral QAC breakfast   PRN:  acetaminophen, oxyCODONE, senna-docusate  Diet:  General  Activity Up with assistance DVT Prophylaxis:  scds - Lovenox  CLINICALLY SIGNIFICANT STUDIES Basic Metabolic Panel:   Recent Labs Lab 10/02/13 1357 10/02/13 2115 10/03/13 0447   NA 141  --  141  K 4.8  --  4.2  CL 106  --  106  CO2 25  --  26  GLUCOSE 94  --  95  BUN 24*  --  26*  CREATININE 1.14 1.29 1.21  CALCIUM 9.1  --  9.0   Liver Function Tests:   Recent Labs Lab 10/02/13 1357  AST 21  ALT 16  ALKPHOS 94  BILITOT 0.5  PROT 6.7  ALBUMIN 3.5   CBC:   Recent Labs Lab 10/02/13 1357 10/02/13 2115 10/03/13 0447  WBC 5.6 9.9 8.3  NEUTROABS 3.4  --   --   HGB 14.0 14.7 13.2  HCT 40.5 42.3 38.9*  MCV 90.6 91.0 91.1  PLT 165 179 162   Coagulation:   Recent Labs Lab 10/02/13 1357  LABPROT 13.3  INR 1.03   Cardiac Enzymes: No results found for this basename: CKTOTAL, CKMB, CKMBINDEX, TROPONINI,  in the last 168 hours Urinalysis:   Recent Labs Lab 10/02/13 1400  COLORURINE YELLOW  LABSPEC 1.016  PHURINE 5.0  GLUCOSEU NEGATIVE  HGBUR NEGATIVE  BILIRUBINUR NEGATIVE  KETONESUR NEGATIVE  PROTEINUR NEGATIVE  UROBILINOGEN 0.2  NITRITE NEGATIVE  LEUKOCYTESUR NEGATIVE   Lipid Panel    Component Value Date/Time   CHOL 155 10/04/2013 0020   TRIG 134  10/04/2013 0020   HDL 39* 10/04/2013 0020   CHOLHDL 4.0 10/04/2013 0020   VLDL 27 10/04/2013 0020   LDLCALC 89 10/04/2013 0020   HgbA1C  Lab Results  Component Value Date   HGBA1C 5.6 10/03/2013    Urine Drug Screen:     Component Value Date/Time   LABOPIA NONE DETECTED 10/02/2013 1400   COCAINSCRNUR NONE DETECTED 10/02/2013 1400   LABBENZ NONE DETECTED 10/02/2013 1400   AMPHETMU NONE DETECTED 10/02/2013 1400   THCU NONE DETECTED 10/02/2013 1400   LABBARB NONE DETECTED 10/02/2013 1400    Alcohol Level:   Recent Labs Lab 10/02/13 1357  ETH <11     CT of the brain  No acute intracranial abnormality  MRI of the brain  Acute nonhemorrhagic infarct involving portions of the right frontal and parietal lobe.  MRA of the brain   Moderate narrowing distal right vertebral artery. Mild narrowing distal left vertebral artery.  Non visualization left posterior inferior cerebellar artery  and right anterior inferior cerebellar artery.  No high-grade stenosis of the basilar artery.   2D Echocardiogram  pending  Carotid Doppler  Preliminary findings: Bilateral: 1-39% ICA stenosis. Vertebral artery flow is antegrade.   CXR  1. Increased prominence of the superior mediastinum, concerning for possible adenopathy. 2. Interval sclerotic scapular metastasis.  EKG sinus rhythm rate 74 beats per minute  Therapy Recommendations home health physical therapy recommended.  Physical Exam    Mental Status:  Alert, oriented, thought content appropriate. Speech dysarthric without evidence of aphasia. Able to follow 3 step commands without difficulty.  Cranial Nerves:  II: Discs flat bilaterally; Visual fields grossly normal, pupils equal, round, reactive to light and accommodation  III,IV, VI: ptosis not present, extra-ocular motions intact bilaterally  V,VII: smile symmetric, facial light touch sensation normal bilaterally  VIII: hearing normal bilaterally  IX,X: gag reflex present  XI: bilateral shoulder shrug  XII: midline tongue extension without atrophy or fasciculations  Motor:  Right : Upper extremity 5/5 Left: Upper extremity 5/5  Lower extremity 5/5 Lower extremity 4/5  Tone and bulk:normal tone throughout; no atrophy noted  Sensory: Pinprick and light touch intact throughout, bilaterally  Deep Tendon Reflexes:  Right: Upper Extremity Left: Upper extremity  biceps (C-5 to C-6) 2/4 biceps (C-5 to C-6) 2/4  tricep (C7) 2/4 triceps (C7) 2/4  Brachioradialis (C6) 2/4 Brachioradialis (C6) 2/4  Lower Extremity Lower Extremity  quadriceps (L-2 to L-4) 2/4 quadriceps (L-2 to L-4) 2/4  Achilles (S1) 2/4 Achilles (S1) 2/4  Plantars:  Right: downgoing Left: downgoing  Cerebellar:  normal finger-to-nose, normal heel-to-shin test  Gait: normal  CV: pulses palpable throughout    ASSESSMENT 79 y.o. male presenting with acute right parietal infarct suspicious for cardio embolic  source. Outside TPA window. MRI revealed an acute nonhemorrhagic infarct involving portions of the right frontal and parietal lobe. Pt has hx of prostate CA diagnosed 15 yrs ago thought to be in remission but abnormal chest x-ray as above. 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 The patient was on no antithrombotic medications prior to admission. Currently he is on aspirin 325 mg daily as well as Plavix 75 mg daily. Lipitor also started.   Possibly hypercoagulable state   Abnormal chest x-ray as above  History of prostate cancer - patient to followup with oncology at [facility] day # 2  TREATMENT/PLAN  Con't pt/ot - home health physical and speech therapies recommended.  Due to high intravascular stenosis in  anterior and posterior circulation please start pt on dual anti platelet use based on sammpris trial. Start ASA 325 and plavix 75 for 90 days. After 90 days d/c plavix  Out pt f/up with urology  Followup Dr. Pearlean Brownie in 2 months.  Delton See PA-C [facility] Pager [phone] 10/04/2013, 10:03 AM  I have personally obtained a history, examined the patient, evaluated imaging results, and formulated the assessment and plan of care. I agree with the above.  Dual anti platelet for 90 days then ASA alone due to high degree of intravascular stenosis.   Pauletta Browns

## 2013-10-04 NOTE — Progress Notes (Signed)
Physical Therapy Treatment Patient Details Name: Richard Jacobs MRN: [id number] DOB: January 16, 1928 Today's Date: 10/04/2013 Time: 7829-5621 PT Time Calculation (min): 39 min  PT Assessment / Plan / Recommendation  History of Present Illness 79 y.o. male who has history of prostate cancer and hypothyroidism. Last night at approximately 2300 hours he noted his speech was slurred and he had a left facial droop and slight feeling of being off balance but not listing or leaning to one side. Patient did not seek medical attention at that time. Upon waking his symptoms had not fully cleared and wife became worried he may have had a stroke. Patient was brought to ED for further evaluation. Initial CT head was negative for acute CVA but follow up MRI revealed a right parietal and cortical infarct in the right MCA distribution. Patient was not a tPA candidate due to LSN >8 hours prior   PT Comments   Pt presents with improved safety/stability in preparation for d/c home.  Instructed to continue use of RW at home while working with PT and until PT releases pt to less restrictive device.  Pt with good understanding of precautions and ready for d/c at MD's ok.    Follow Up Recommendations  Home health PT;Supervision/Assistance - 24 hour     Does the patient have the potential to tolerate intense rehabilitation     Barriers to Discharge        Equipment Recommendations  Rolling walker with 5" wheels    Recommendations for Other Services    Frequency Min 4X/week   Progress towards PT Goals Progress towards PT goals: Progressing toward goals  Plan Current plan remains appropriate    Precautions / Restrictions Precautions Precautions: Fall Precaution Comments: weakness/motor control deficit L LE, use RW, supervision   Pertinent Vitals/Pain no apparent distress     Mobility  Bed Mobility Bed Mobility: Not assessed Transfers Transfers: Sit to Stand;Stand to Sit Sit to Stand: 5: Supervision;With  armrests;From chair/3-in-1 Stand to Sit: 5: Supervision;With armrests;To chair/3-in-1 Details for Transfer Assistance: cues to push from chair Ambulation/Gait Ambulation/Gait Assistance: 5: Supervision;4: Min guard Ambulation Distance (Feet): 200 Feet Assistive device: Rolling walker;None Ambulation/Gait Assistance Details: with RW, pt is supervision with ongoing cues for head postion (look forward) and keep close to RW as well as cues for increase heel strike and increased step length left LE;  WITHOUT RW pt requires min guard assist and cues for looking up and step length noting increase fatigue in left LE over time; education on fatigue followin stroke Gait Pattern: Step-through pattern;Decreased step length - left;Decreased dorsiflexion - left;Left flexed knee in stance;Trunk flexed;Narrow base of support Gait velocity: decreased General Gait Details: improved stability with device, requires cues for optimal safe use; pt reports decreased feeling of stability with hips closer to walker, educated on risks of flexed posture using device;  Pt instructed to use RW unless with PT and until PT releases to LRAD;  Stairs: No Wheelchair Mobility Wheelchair Mobility: No Modified Rankin (Stroke Patients Only) Pre-Morbid Rankin Score: No significant disability Modified Rankin: Moderately severe disability    Exercises Other Exercises Other Exercises: seated LAQ, hold in extension to perform AP with emphasis on DF, eccentric return to knee flexion; repeat 5 times; do 5 sessions daily Other Exercises: instructed on Left UE exercise for hand dexterity using towel -- ball up washcloth, then flatten cloth using only left hand; repeat 5 times and 5 sessions daily   PT Diagnosis:    PT Problem List:  PT Treatment Interventions:     PT Goals (current goals can now be found in the care plan section) Acute Rehab PT Goals Patient Stated Goal: to go home PT Goal Formulation: With patient/family  Visit  Information  Last PT Received On: 10/04/13 Assistance Needed: +1 History of Present Illness: 79 y.o. male who has history of prostate cancer and hypothyroidism. Last night at approximately 2300 hours he noted his speech was slurred and he had a left facial droop and slight feeling of being off balance but not listing or leaning to one side. Patient did not seek medical attention at that time. Upon waking his symptoms had not fully cleared and wife became worried he may have had a stroke. Patient was brought to ED for further evaluation. Initial CT head was negative for acute CVA but follow up MRI revealed a right parietal and cortical infarct in the right MCA distribution. Patient was not a tPA candidate due to LSN >8 hours prior    Subjective Data  Subjective: Can I get out of here? Patient Stated Goal: to go home   Cognition  Cognition Arousal/Alertness: Awake/alert Behavior During Therapy: WFL for tasks assessed/performed Overall Cognitive Status: Within Functional Limits for tasks assessed    Balance     End of Session PT - End of Session Activity Tolerance: Patient tolerated treatment well Patient left: in chair;with call bell/phone within reach;with family/visitor present Nurse Communication: Mobility status   GP     Dennis Bast 10/04/2013, 11:47 AM

## 2013-10-04 NOTE — Progress Notes (Addendum)
   CARE MANAGEMENT NOTE 10/04/2013  Patient:  Richard Jacobs, Richard Jacobs   Account Number:  [id number]  Date Initiated:  10/04/2013  Documentation initiated by:  [facility]  Subjective/Objective Assessment:   adm: Slurred speech/facial droop     Action/Plan:   discharge planning   Anticipated DC Date:  10/04/2013   Anticipated DC Plan:  HOME W HOME HEALTH SERVICES      DC Planning Services  CM consult      [facility]  HOME HEALTH   Choice offered to / List presented to:  C-3 Spouse      DME agency  [facility]     HH arranged  HH-2 PT      [facility] agency  [facility]   Status of service:  Completed, signed off Medicare Important Message given?   (If response is "NO", the following Medicare IM given date fields will be blank) Date Medicare IM given:   Date Additional Medicare IM given:    Discharge Disposition:  HOME W HOME HEALTH SERVICES  Per UR Regulation:    If discussed at Long Length of Stay Meetings, dates discussed:    Comments:  10/04/13 13:50 MD called to request HHPT.  CM spoke with spouse of pt sho states she chooses [facility] for HHPT.  Rolling wlaker to be delivered to room prior to discharge.  Address and contact number verified.  No other CM needs were communicated.  Freddy Jaksch, BSN, CM [phone].

## 2013-10-04 NOTE — Discharge Summary (Signed)
Physician Discharge Summary  [name]:[id number] DOB: 07-04-1928 DOA: 10/02/2013  PCP: Minda Meo, MD  Admit date: 10/02/2013 Discharge date: 10/04/2013  Time spent: 50* minutes  Recommendations for Outpatient Follow-up:  1. Follow up Dr Pearlean Brownie in 2 months  Discharge Diagnoses:  Active Problems:   Malignant neoplasm of prostate   CVA (cerebral infarction)   Hypothyroidism   Discharge Condition: *Stable  Diet recommendation: Low fat diet  Filed Weights   10/02/13 2126  Weight: 95.1 kg (209 lb 10.5 oz)    History of present illness:  79 y.o. male with a past medical history of hypothyroidism and prostate cancer, presenting with complaints of slurred speech. He reports being in his usual state health, woke up this morning, family members noted him to have slurred speech as well as a right facial droop. Patient was last seen in his usual state health at 11:00 last night. He reported having associated lower lip numbness as well as being "off balance" however did not have any falls. He denies extremity weakness, or alteration to sensation of his extremities, denied history of CVA. Symptoms persisted through the day and while in the emergency department. CT scan of brain without contrast performed in the ED showing no acute intracranial abnormalities. He had not been on aspirin therapy prior to this presentation denied a history of CVA. He was given 25 mg of aspirin in the emergency department. He currently denies nausea vomiting fevers chills chest pain shortness of breath.   Hospital Course:  1. Acute right frontal and parietal lobe nonhemorrhagic infarct- patient has been seen by stroke team and at this time he will be started on dual platelet therapy including full dose aspirin and Plavix 75 mg for 90 days. After 90 days Plavix can be discontinued and aspirin should be continued. Patient will go home on HHPT. Will also discharge on Lipitor 20 mg po daily as per Neurology  recommendation.  2. Scapular metastasis- patient has history of prostate cancer and is being followed by oncology at [facility]. The chest x-ray shows interval sclerotic scapular metastasis with most likely cause as prostate cancer. Patient will follow up with his oncologist for further workup including bone scan and PET scan.  3. Hypothyroidism-patient's TSH is 1.685, continue with Synthroid 50 mcg by mouth daily.   Procedures: 2DEchocardiogram- Wall thickness was increased in a pattern of mild LVH. Systolic function was normal. The estimated ejection fraction was in the range of 55% to 60%. Wall motion was normal; there were no regional wall motion abnormalities. Doppler parameters are consistent with abnormal left ventricular relaxation (grade 1 diastolic dysfunction). Doppler parameters are consistent with elevated mean left atrial filling pressure  Carotid duplex- Carotid Duplex (Doppler) has been completed. Preliminary findings: Bilateral: 1-39% ICA stenosis. Vertebral artery flow is antegrade  Consultations:  Neurology  Discharge Exam: Filed Vitals:   10/04/13 0911  BP: 120/65  Pulse: 83  Temp: 98.2 F (36.8 C)  Resp: 18    General: Appear in no acute distress Cardiovascular: S1S2 RRR Respiratory: *Clear bialetrally  Discharge Instructions  Discharge Orders   Future Orders Complete By Expires   Diet - low sodium heart healthy  As directed    Increase activity slowly  As directed        Medication List         aspirin 325 MG tablet  Take 1 tablet (325 mg total) by mouth daily.     atorvastatin 20 MG tablet  Commonly known as:  LIPITOR  Take 1 tablet (20 mg total) by mouth daily.     clopidogrel 75 MG tablet  Commonly known as:  PLAVIX  Take 1 tablet (75 mg total) by mouth daily with breakfast.     levothyroxine 50 MCG tablet  Commonly known as:  SYNTHROID, LEVOTHROID  Take 50 mcg by mouth daily.       No Known Allergies     Follow-up  Information   Follow up with Gates Rigg, MD In 2 months. (On Monday call for an appointment to be seen in 2 months)    Specialties:  Neurology, Radiology   Contact information:   [address] [place] [address] [phone]       Follow up with [facility]. (home health physical therapy)    Contact information:   [address] [place] [address] [phone]        The results of significant diagnostics from this hospitalization (including imaging, microbiology, ancillary and laboratory) are listed below for reference.    Significant Diagnostic Studies: Dg Chest 2 View  10/02/2013   CLINICAL DATA:  Stroke. Prostate cancer. Increased activity in the left shoulder in the scapular region on a previous nuclear medicine bone scan.  EXAM: CHEST  2 VIEW  COMPARISON:  01/28/2004. Nuclear medicine bone scan dated 04/25/2012.  FINDINGS: Poor inspiration. Borderline enlarged cardiac silhouette. Clear lungs. Tortuous aorta. Increased prominence of the superior mediastinum. Unremarkable bones.  IMPRESSION: 1. Increased prominence of the superior mediastinum, concerning for possible adenopathy. 2. Interval sclerotic scapular metastasis. .   Electronically Signed   By: Gordan Payment M.D.   On: 10/02/2013 22:04   Ct Head Wo Contrast  10/02/2013   CLINICAL DATA:  Significant headache  EXAM: CT HEAD WITHOUT CONTRAST  TECHNIQUE: Contiguous axial images were obtained from the base of the skull through the vertex without intravenous contrast.  COMPARISON:  None.  FINDINGS: No skull fracture is noted. No intracranial hemorrhage, mass effect or midline shift. Mild nodular mucosal thickening left maxillary sinus probable mucous retention cysts. Mastoid air cells are unremarkable. Mild cerebral atrophy. Mild periventricular and patchy subcortical white matter decreased attenuation probable due to chronic small vessel ischemic changes. No acute cortical infarction. No mass  lesion is noted on this unenhanced scan.  IMPRESSION: No acute intracranial abnormality. Mild cerebral atrophy. Mild periventricular and patchy subcortical white matter decreased attenuation probable due to chronic small vessel ischemic changes.   Electronically Signed   By: Natasha Mead M.D.   On: 10/02/2013 13:54   Mr Maxine Glenn Head Wo Contrast  10/02/2013   CLINICAL DATA:  Slurred speech and facial droop.  EXAM: MRI HEAD WITHOUT CONTRAST  MRA HEAD WITHOUT CONTRAST  TECHNIQUE: Multiplanar, multiecho pulse sequences of the brain and surrounding structures were obtained without intravenous contrast. Angiographic images of the head were obtained using MRA technique without contrast.  COMPARISON:  10/02/2013 head CT.  FINDINGS: MRI HEAD FINDINGS  Acute nonhemorrhagic infarct involving portions of the right frontal and parietal lobe.  No intracranial hemorrhage.  Small vessel disease type changes.  Atrophy without hydrocephalus.  No intracranial mass lesion noted on this unenhanced exam.  Mild transverse ligament hypertrophy. Cervical medullary junction, pituitary region, pineal region and orbital structures unremarkable.  Mucosal thickening paranasal sinuses polypoid along the inferior aspect of the maxillary sinuses.  MRA HEAD FINDINGS  Anterior circulation without medium or large size vessel significant stenosis or occlusion.  Moderate narrowing distal right vertebral artery. Mild narrowing distal left vertebral  artery.  Non visualization left posterior inferior cerebellar artery and right anterior inferior cerebellar artery.  No high-grade stenosis of the basilar artery.  Moderate narrowing left and mild narrowing right superior cerebral artery.  No aneurysm noted.  IMPRESSION: Acute nonhemorrhagic infarct involving portions of the right frontal and parietal lobe.  Please see above.  These results will be called to the ordering clinician or representative by the Radiologist Assistant, and communication documented in the  PACS Dashboard.   Electronically Signed   By: Bridgett Larsson M.D.   On: 10/02/2013 18:13   Mr Brain Wo Contrast  10/02/2013   CLINICAL DATA:  Slurred speech and facial droop.  EXAM: MRI HEAD WITHOUT CONTRAST  MRA HEAD WITHOUT CONTRAST  TECHNIQUE: Multiplanar, multiecho pulse sequences of the brain and surrounding structures were obtained without intravenous contrast. Angiographic images of the head were obtained using MRA technique without contrast.  COMPARISON:  10/02/2013 head CT.  FINDINGS: MRI HEAD FINDINGS  Acute nonhemorrhagic infarct involving portions of the right frontal and parietal lobe.  No intracranial hemorrhage.  Small vessel disease type changes.  Atrophy without hydrocephalus.  No intracranial mass lesion noted on this unenhanced exam.  Mild transverse ligament hypertrophy. Cervical medullary junction, pituitary region, pineal region and orbital structures unremarkable.  Mucosal thickening paranasal sinuses polypoid along the inferior aspect of the maxillary sinuses.  MRA HEAD FINDINGS  Anterior circulation without medium or large size vessel significant stenosis or occlusion.  Moderate narrowing distal right vertebral artery. Mild narrowing distal left vertebral artery.  Non visualization left posterior inferior cerebellar artery and right anterior inferior cerebellar artery.  No high-grade stenosis of the basilar artery.  Moderate narrowing left and mild narrowing right superior cerebral artery.  No aneurysm noted.  IMPRESSION: Acute nonhemorrhagic infarct involving portions of the right frontal and parietal lobe.  Please see above.  These results will be called to the ordering clinician or representative by the Radiologist Assistant, and communication documented in the PACS Dashboard.   Electronically Signed   By: Bridgett Larsson M.D.   On: 10/02/2013 18:13    Microbiology: No results found for this or any previous visit (from the past 240 hour(s)).   Labs: Basic Metabolic Panel:  Recent  Labs Lab 10/02/13 1357 10/02/13 2115 10/03/13 0447  NA 141  --  141  K 4.8  --  4.2  CL 106  --  106  CO2 25  --  26  GLUCOSE 94  --  95  BUN 24*  --  26*  CREATININE 1.14 1.29 1.21  CALCIUM 9.1  --  9.0   Liver Function Tests:  Recent Labs Lab 10/02/13 1357  AST 21  ALT 16  ALKPHOS 94  BILITOT 0.5  PROT 6.7  ALBUMIN 3.5   No results found for this basename: LIPASE, AMYLASE,  in the last 168 hours No results found for this basename: AMMONIA,  in the last 168 hours CBC:  Recent Labs Lab 10/02/13 1357 10/02/13 2115 10/03/13 0447  WBC 5.6 9.9 8.3  NEUTROABS 3.4  --   --   HGB 14.0 14.7 13.2  HCT 40.5 42.3 38.9*  MCV 90.6 91.0 91.1  PLT 165 179 162   Cardiac Enzymes: No results found for this basename: CKTOTAL, CKMB, CKMBINDEX, TROPONINI,  in the last 168 hours BNP: BNP (last 3 results) No results found for this basename: PROBNP,  in the last 8760 hours CBG:  Recent Labs Lab 10/02/13 1321  GLUCAP 89  SignedMauro Kaufmann S  [facility] 10/04/2013, 1:54 PM

## 2013-10-16 ENCOUNTER — Telehealth: Payer: Self-pay

## 2013-10-16 DIAGNOSIS — I6350 Cerebral infarction due to unspecified occlusion or stenosis of unspecified cerebral artery: Secondary | ICD-10-CM

## 2013-10-16 NOTE — Telephone Encounter (Signed)
Ok to order outpt Pt/OT

## 2013-10-16 NOTE — Telephone Encounter (Signed)
I called Dorice Lamas, PT @ [facility] to clarify who the patient was, as Nurse Young could not pull up patient with information she had written down, per Nurse Young's request.    Patient has had 5 PT visits, 1 OT visit and 3 ST visits, per Ms. Yetta Barre.   Patient will need referral to start PT, OT, ST at [facility] beginning the week after Christmas.   I will refer the information on to Dr. Pearlean Brownie to make the appropriate referrals. Patient is scheduled to see Dr. Pearlean Brownie on December 08, 2013 at 1:00 p.m.

## 2013-10-23 ENCOUNTER — Telehealth: Payer: Self-pay | Admitting: Neurology

## 2013-10-23 NOTE — Telephone Encounter (Signed)
Patient's wife called and stated that she is waiting on a call about outpatient therapy for patient who had a stroke on Dec. 5 and who was under the care of Dr. Pearlean Brownie. Patient's wife has not heard anything about the therapy and wants someone to call her back about this. Please call.

## 2013-10-26 NOTE — Telephone Encounter (Signed)
This phone has already been taking care of by A nurse.

## 2013-11-04 ENCOUNTER — Telehealth: Payer: Self-pay | Admitting: Neurology

## 2013-11-04 NOTE — Telephone Encounter (Signed)
CALLING AGAIN REGARDING PHYSICAL THERAPY

## 2013-11-04 NOTE — Telephone Encounter (Signed)
Calling back to check the status of this order. I reviewed chart and saw that the orders were in as of 10/20/13 and advised Peri Jefferson that the patient is calling so that she can forward information

## 2013-11-04 NOTE — Telephone Encounter (Signed)
Please advise 

## 2013-11-16 ENCOUNTER — Ambulatory Visit: Payer: Medicare Other | Attending: Neurology | Admitting: Occupational Therapy

## 2013-11-16 ENCOUNTER — Ambulatory Visit: Payer: Medicare Other | Admitting: Physical Therapy

## 2013-11-16 DIAGNOSIS — R279 Unspecified lack of coordination: Secondary | ICD-10-CM | POA: Insufficient documentation

## 2013-11-16 DIAGNOSIS — R269 Unspecified abnormalities of gait and mobility: Secondary | ICD-10-CM | POA: Insufficient documentation

## 2013-11-16 DIAGNOSIS — Z5189 Encounter for other specified aftercare: Secondary | ICD-10-CM | POA: Insufficient documentation

## 2013-11-16 DIAGNOSIS — I69959 Hemiplegia and hemiparesis following unspecified cerebrovascular disease affecting unspecified side: Secondary | ICD-10-CM | POA: Insufficient documentation

## 2013-11-18 ENCOUNTER — Ambulatory Visit: Payer: Medicare Other | Admitting: Physical Therapy

## 2013-11-24 ENCOUNTER — Ambulatory Visit: Payer: Medicare Other | Admitting: Physical Therapy

## 2013-11-24 ENCOUNTER — Other Ambulatory Visit: Payer: Self-pay | Admitting: *Deleted

## 2013-11-24 DIAGNOSIS — I6350 Cerebral infarction due to unspecified occlusion or stenosis of unspecified cerebral artery: Secondary | ICD-10-CM

## 2013-11-24 DIAGNOSIS — R4781 Slurred speech: Secondary | ICD-10-CM

## 2013-11-24 NOTE — Progress Notes (Signed)
Transition to outpt from [place].

## 2013-11-26 ENCOUNTER — Ambulatory Visit: Payer: Medicare Other | Admitting: Physical Therapy

## 2013-11-26 ENCOUNTER — Ambulatory Visit: Payer: Medicare Other | Admitting: Speech Pathology

## 2013-11-30 ENCOUNTER — Ambulatory Visit: Payer: Medicare Other | Attending: Neurology | Admitting: Physical Therapy

## 2013-11-30 DIAGNOSIS — R269 Unspecified abnormalities of gait and mobility: Secondary | ICD-10-CM | POA: Insufficient documentation

## 2013-11-30 DIAGNOSIS — Z5189 Encounter for other specified aftercare: Secondary | ICD-10-CM | POA: Insufficient documentation

## 2013-11-30 DIAGNOSIS — R279 Unspecified lack of coordination: Secondary | ICD-10-CM | POA: Insufficient documentation

## 2013-11-30 DIAGNOSIS — I69959 Hemiplegia and hemiparesis following unspecified cerebrovascular disease affecting unspecified side: Secondary | ICD-10-CM | POA: Insufficient documentation

## 2013-12-03 ENCOUNTER — Ambulatory Visit: Payer: Medicare Other | Admitting: Physical Therapy

## 2013-12-08 ENCOUNTER — Encounter: Payer: Self-pay | Admitting: Neurology

## 2013-12-08 ENCOUNTER — Ambulatory Visit (INDEPENDENT_AMBULATORY_CARE_PROVIDER_SITE_OTHER): Payer: Medicare Other | Admitting: Neurology

## 2013-12-08 VITALS — BP 110/63 | HR 75 | Ht 69.0 in | Wt 208.0 lb

## 2013-12-08 DIAGNOSIS — I6350 Cerebral infarction due to unspecified occlusion or stenosis of unspecified cerebral artery: Secondary | ICD-10-CM

## 2013-12-08 NOTE — Patient Instructions (Signed)
I had a long discussion with patient and his wife regarding his recent stroke, discussed results of hospital evaluation, stroke risk factors, secondary stroke prevention strategies and answered questions. I recommend he discontinue aspirin after one month and stay on Plavix alone. Continue Lipitor dose hyperlipidemia with LDL cholesterol goal below 100 mg percent. Patient will continue outpatient therapy which would be ending soon. He plans on having a skin lesion removed by his dermatologist he may have to hold Plavix for 5 days prior to the procedure but I explained to him that the risk of stroke recurrence is maximum in the first 3 months and never completely goes away but the risk does go down after that.

## 2013-12-08 NOTE — Progress Notes (Signed)
[facility] [address] [place]. [place] [address] [phone]       OFFICE CONSULT NOTE  [name] Date of Birth:  30-Jul-1928 Medical Record Number:  [id number]   Referring MD:  Dr Richard Jacobs  Reason for Referral:  Stroke  HPI: 53 year Caucasian male who developed left lower lip numbness and gait imbalance  night of 10/01/13 but did not seek help until next day when he developed slurred speech and left facial droop.I have reviewed his hospital admission records. CT head showed no acute abnormalities and MRI brain personnaly reviewed by me showed patchy right pareital subcortical and cortical small infarct. 2DEcho and carotid dopplers were unremarkable and MRA brain showed no large vessel stenosis. He was seen bu neurohospitalist and started on aspirin and plavix and has done well and symptoms mostly resolved at discharge. He has finished outpatient therapies and has been tolerating lipitor without myalgias and dual antiplatelet therapy without bleeding or bruising . He does notice some drooling from left corner of mouth at night upon awakening in am.He has been doing exercises taught to him by speech therapy regularly.He state s his blood pressure is well controlled and it is 110/63 in our office.He has h/o melanomas and is scheduled to have one removed from his left foot soon and is concerned about stopping antiplatelets and stroke risk.He has no prior h/o stroke, TIA, seizures or significant neurological problems.  ROS:   14 system review of systems is positive for fatigue, moles, slurred speech, snoring, decreased energy and all other systems negative  PMH:  Past Medical History  Diagnosis Date  . Thyroid disease   . Cancer     prostate  . Hypothyroidism     Social History:  History   Social History  . Marital Status: Married    Spouse Name: frene    Number of Children: 3  . Years of Education: college   Occupational History  . retired     Social History Main Topics  . Smoking status: Former Research scientist (life sciences)  . Smokeless tobacco: Current User    Types: Chew  . Alcohol Use: Yes     Comment: occasional  . Drug Use: No  . Sexual Activity: Yes   Other Topics Concern  . Not on file   Social History Narrative  . No narrative on file    Medications:   Current Outpatient Prescriptions on File Prior to Visit  Medication Sig Dispense Refill  . aspirin 325 MG tablet Take 1 tablet (325 mg total) by mouth daily.  30 tablet  2  . atorvastatin (LIPITOR) 20 MG tablet Take 1 tablet (20 mg total) by mouth daily.  30 tablet  2  . clopidogrel (PLAVIX) 75 MG tablet Take 1 tablet (75 mg total) by mouth daily with breakfast.  30 tablet  2   No current facility-administered medications on file prior to visit.    Allergies:  No Known Allergies  Physical Exam General: well developed, well nourished Caucasian male, seated, in no evident distress Head: head normocephalic and atraumatic. Orohparynx benign Neck: supple with no carotid or supraclavicular bruits Cardiovascular: regular rate and rhythm, no murmurs Musculoskeletal: no deformity Skin:  no rash/petichiae Vascular:  Normal pulses all extremities Filed Vitals:   12/08/13 1245  BP: 110/63  Pulse: 75    Neurologic Exam Mental Status: Awake and fully alert. Oriented to place and time. Recent and remote memory intact. Attention span, concentration and fund of knowledge appropriate. Mood and affect  appropriate.  Cranial Nerves: Fundoscopic exam reveals sharp disc margins. Pupils equal, briskly reactive to light. Extraocular movements full without nystagmus. Visual fields full to confrontation. Hearing intact. Facial sensation intact. Mild lower left Face weak., tongue, palate moves normally and symmetrically.  Motor: Normal bulk and tone. Normal strength in all tested extremity muscles.diminished fine finger movements on left. Orbits right over left upper extremity. Sensory.: intact to  tough and pinprick and vibratory.  Coordination: Rapid alternating movements normal in all extremities. Finger-to-nose and heel-to-shin performed accurately bilaterally. Gait and Station: Arises from chair without difficulty. Stance is normal. Gait demonstrates normal stride length and balance . Able to heel, toe and tandem walk without difficulty.  Reflexes: 1+ and symmetric. Toes downgoing.   NIHSS 1 Modified Rankin  1   ASSESSMENT: 80 year old Caucasian male with a right MCA branch infarct in December 2014 probably of an embolic etiology without definite identified source.    PLAN: I had a long discussion with patient and his wife regarding his recent stroke, discussed results of hospital evaluation, stroke risk factors, secondary stroke prevention strategies and answered questions. I recommend he discontinue aspirin after one month and stay on Plavix alone. Continue Lipitor dose hyperlipidemia with LDL cholesterol goal below 100 mg percent. Patient will continue outpatient therapy which would be ending soon. He plans on having a skin lesion removed by his dermatologist he may have to hold Plavix for 5 days prior to the procedure but I explained to him that the risk of stroke recurrence is maximum in the first 3 months and never completely goes away but the risk does go down after that.   Note: This document was prepared with digital dictation and possible smart phrase technology. Any transcriptional errors that result from this process are unintentional.

## 2013-12-10 ENCOUNTER — Ambulatory Visit: Payer: Medicare Other | Admitting: Physical Therapy

## 2013-12-14 ENCOUNTER — Ambulatory Visit: Payer: Medicare Other | Admitting: Physical Therapy

## 2014-02-11 ENCOUNTER — Emergency Department (HOSPITAL_COMMUNITY)
Admission: EM | Admit: 2014-02-11 | Discharge: 2014-02-11 | Discharge status: Against Medical Advice | Payer: Medicare Other | Attending: Emergency Medicine | Admitting: Emergency Medicine

## 2014-02-11 ENCOUNTER — Encounter (HOSPITAL_COMMUNITY): Payer: Self-pay | Admitting: Emergency Medicine

## 2014-02-11 DIAGNOSIS — K5900 Constipation, unspecified: Secondary | ICD-10-CM | POA: Insufficient documentation

## 2014-02-11 NOTE — ED Notes (Signed)
Pt's significant other informed about wait time, family verbalized understanding - pt condition stable and unchanged.

## 2014-02-11 NOTE — ED Notes (Signed)
He states he has felt stool stuck in rectum since yesterday. States "stool will leak around but i cant get the blockage out." wife tried an enema "but it wouldn't go in." he states he sat on the toilet all day today with no relief

## 2014-02-12 ENCOUNTER — Ambulatory Visit (INDEPENDENT_AMBULATORY_CARE_PROVIDER_SITE_OTHER): Payer: Medicare Other | Admitting: Family Medicine

## 2014-02-12 ENCOUNTER — Encounter: Payer: Self-pay | Admitting: Family Medicine

## 2014-02-12 VITALS — BP 144/58 | HR 96 | Temp 97.8°F | Resp 16 | Ht 70.0 in | Wt 210.0 lb

## 2014-02-12 DIAGNOSIS — K5900 Constipation, unspecified: Secondary | ICD-10-CM

## 2014-02-12 DIAGNOSIS — R109 Unspecified abdominal pain: Secondary | ICD-10-CM

## 2014-02-12 LAB — IFOBT (OCCULT BLOOD): IFOBT: POSITIVE

## 2014-02-12 NOTE — Patient Instructions (Signed)
Take one bottle of Fleet's phospho soda   Constipation, Adult Constipation is when a person has fewer than 3 bowel movements a week; has difficulty having a bowel movement; or has stools that are dry, hard, or larger than normal. As people grow older, constipation is more common. If you try to fix constipation with medicines that make you have a bowel movement (laxatives), the problem may get worse. Long-term laxative use may cause the muscles of the colon to become weak. A low-fiber diet, not taking in enough fluids, and taking certain medicines may make constipation worse. CAUSES   Certain medicines, such as antidepressants, pain medicine, iron supplements, antacids, and water pills.   Certain diseases, such as diabetes, irritable bowel syndrome (IBS), thyroid disease, or depression.   Not drinking enough water.   Not eating enough fiber-rich foods.   Stress or travel.  Lack of physical activity or exercise.  Not going to the restroom when there is the urge to have a bowel movement.  Ignoring the urge to have a bowel movement.  Using laxatives too much. SYMPTOMS   Having fewer than 3 bowel movements a week.   Straining to have a bowel movement.   Having hard, dry, or larger than normal stools.   Feeling full or bloated.   Pain in the lower abdomen.  Not feeling relief after having a bowel movement. DIAGNOSIS  Your caregiver will take a medical history and perform a physical exam. Further testing may be done for severe constipation. Some tests may include:   A barium enema X-ray to examine your rectum, colon, and sometimes, your small intestine.  A sigmoidoscopy to examine your lower colon.  A colonoscopy to examine your entire colon. TREATMENT  Treatment will depend on the severity of your constipation and what is causing it. Some dietary treatments include drinking more fluids and eating more fiber-rich foods. Lifestyle treatments may include regular exercise.  If these diet and lifestyle recommendations do not help, your caregiver may recommend taking over-the-counter laxative medicines to help you have bowel movements. Prescription medicines may be prescribed if over-the-counter medicines do not work.  HOME CARE INSTRUCTIONS   Increase dietary fiber in your diet, such as fruits, vegetables, whole grains, and beans. Limit high-fat and processed sugars in your diet, such as [place] fries, hamburgers, cookies, candies, and soda.   A fiber supplement may be added to your diet if you cannot get enough fiber from foods.   Drink enough fluids to keep your urine clear or pale yellow.   Exercise regularly or as directed by your caregiver.   Go to the restroom when you have the urge to go. Do not hold it.  Only take medicines as directed by your caregiver. Do not take other medicines for constipation without talking to your caregiver first. [place] IF:   You have bright red blood in your stool.   Your constipation lasts for more than 4 days or gets worse.   You have abdominal or rectal pain.   You have thin, pencil-like stools.  You have unexplained weight loss. MAKE SURE YOU:   Understand these instructions.  Will watch your condition.  Will get help right away if you are not doing well or get worse. Document Released: 07/13/2004 Document Revised: 01/07/2012 Document Reviewed: 07/27/2013 [facility] Patient Information 2014 [place], [place].

## 2014-02-12 NOTE — Progress Notes (Signed)
He reports that he has been feeling constipated recently. He states that he has fesces on TP when he wipes but majority of his bowels are not moving. He states passing only minimal amounts of stool is passing enough to have trace of stool on TP. He feels a strong urge to move his bowels but when he sits on the toilet, he is unable to move his bowels to the feeling of a blockage. He is however able to void although he was incontinent of urine day or so ago. He is followed by Dr. Marcello Moores at [facility] and followed for his hx of prostate CA. He denies fever.    Objective: Patient is mildly uncomfortable but his gait is stable, he is alert and cooperative Abdomen: Soft nontender without HSM or masses Rectal exam reveals large soft stool in the rectal ampulla  Assessment: Constipation that should resolve with laxative. No sign of acute abdomen  Plan: Fleets Phospho-Soda Return as needed  Signed, Rise Mu.D.

## 2014-03-25 ENCOUNTER — Encounter: Payer: Self-pay | Admitting: Nurse Practitioner

## 2014-03-25 ENCOUNTER — Encounter (INDEPENDENT_AMBULATORY_CARE_PROVIDER_SITE_OTHER): Payer: Self-pay

## 2014-03-25 ENCOUNTER — Ambulatory Visit (INDEPENDENT_AMBULATORY_CARE_PROVIDER_SITE_OTHER): Payer: Medicare Other | Admitting: Nurse Practitioner

## 2014-03-25 VITALS — BP 120/65 | HR 70 | Ht 70.0 in | Wt 201.0 lb

## 2014-03-25 DIAGNOSIS — I6350 Cerebral infarction due to unspecified occlusion or stenosis of unspecified cerebral artery: Secondary | ICD-10-CM

## 2014-03-25 DIAGNOSIS — R4781 Slurred speech: Secondary | ICD-10-CM

## 2014-03-25 DIAGNOSIS — C61 Malignant neoplasm of prostate: Secondary | ICD-10-CM

## 2014-03-25 DIAGNOSIS — R4789 Other speech disturbances: Secondary | ICD-10-CM

## 2014-03-25 NOTE — Patient Instructions (Signed)
Continue Plavix for secondary stroke prevention. Continue Lipitor for hyperlipidemia with LDL cholesterol goal below 100 mg percent. Follow up in 6 months, sooner as needed.

## 2014-03-25 NOTE — Progress Notes (Signed)
PATIENT: Richard Jacobs DOB: [birth year]  REASON FOR VISIT: routine stroke follow up HISTORY FROM: patient  HISTORY OF PRESENT ILLNESS: 80 year Caucasian male who developed left lower lip numbness and gait imbalance night of 10/01/13 but did not seek help until next day when he developed slurred speech and left facial droop.  I have reviewed his hospital admission records. CT head showed no acute abnormalities and MRI brain personally reviewed by me showed patchy right parietal subcortical and cortical small infarct. 2DEcho and carotid dopplers were unremarkable and MRA brain showed no large vessel stenosis. He was seen by neurohospitalist and started on aspirin and plavix and has done well and symptoms mostly resolved at discharge. He has finished outpatient therapies and has been tolerating lipitor without myalgia's and dual antiplatelet therapy without bleeding or bruising. He does notice some drooling from left corner of mouth at night upon awakening in am.  He has been doing exercises taught to him by speech therapy regularly.  He states his blood pressure is well controlled and it is 110/63 in our office.  He has h/o melanomas and is scheduled to have one removed from his left foot soon and is concerned about stopping antiplatelet and stroke risk.  He has no prior h/o stroke, TIA, seizures or significant neurological problems.   UPDATE 03/25/14 (LL): Since last visit, patient has been doing well.  He is staying active.  He does not feel as strong in his legs that he did before stroke, but he is able to do everything he wants to do.  He is tolerating Plavix well with no signs of significant bleeding or bruising.  His blood pressure is well controlled, it is 120/65 in our office.  He is undergoing treatment for prostate cancer, with Zytiga.  Treatments are going well so far. He has no complaints today.  ROS:  14 system review of systems is positive for moles, snoring, and all other systems  negative  ALLERGIES: No Known Allergies  HOME MEDICATIONS: Outpatient Encounter Prescriptions as of 03/25/2014  Medication Sig Note  . atorvastatin (LIPITOR) 20 MG tablet Take 1 tablet (20 mg total) by mouth daily.   . clopidogrel (PLAVIX) 75 MG tablet Take 75 mg by mouth daily with breakfast.   . levothyroxine (SYNTHROID, LEVOTHROID) 88 MCG tablet Take 88 mcg by mouth daily before breakfast.   . prednisoLONE 5 MG TABS tablet Take 5 mg by mouth daily.   Marland Kitchen ZYTIGA 250 MG tablet  03/25/2014: Received from: External Pharmacy     PHYSICAL EXAM  Filed Vitals:   03/25/14 1425  BP: 120/65  Pulse: 70  Height: 5\' 10"  (1.778 m)  Weight: 201 lb (91.173 kg)   Body mass index is 28.84 kg/(m^2). No exam data present   Physical Exam  General: well developed, well nourished Caucasian male, seated, in no evident distress  Head: head normocephalic and atraumatic. Orohparynx benign  Neck: supple with no carotid or supraclavicular bruits  Cardiovascular: regular rate and rhythm, no murmurs  Musculoskeletal: no deformity  Skin: no rash/petichiae  Vascular: Normal pulses all extremities   Neurologic Exam  Mental Status: Awake and fully alert. Oriented to place and time. Recent and remote memory intact. Attention span, concentration and fund of knowledge appropriate. Mood and affect appropriate.  Cranial Nerves: Pupils equal, briskly reactive to light. Extraocular movements full without nystagmus. Visual fields full to confrontation. Hearing intact. Facial sensation intact. Mild lower left Face weak., tongue, palate moves normally and symmetrically.  Motor: Normal bulk and tone. Normal strength in all tested extremity muscles. Diminished fine finger movements on left. Orbits right over left upper extremity.  Sensory: intact to light touch on all 4 extremities. Coordination: Rapid alternating movements normal in all extremities. Finger-to-nose and heel-to-shin performed accurately bilaterally.  Gait  and Station: Arises from chair without difficulty. Stance is normal. Gait demonstrates normal stride length and balance . Able to heel, toe and tandem walk without difficulty.  Reflexes: 1+ and symmetric. Toes downgoing.   ASSESSMENT AND PLAN 80 year old Caucasian male with a right MCA branch infarct in December 2014 probably of an embolic etiology without definite identified source.   PLAN:  I had a long discussion with 80 and his wife regarding his recent stroke, discussed results of hospital evaluation, stroke risk factors, secondary stroke prevention strategies and answered questions.  Continue Plavix for secondary stroke prevention. Stop Lipitor,  Patient was already under LDL cholesterol goal of 100 mg percent.  He has complaints of weakness in his legs since starting Lipitor, unsure if related.   We will recheck lipid panel in 6 months. Follow up in 6 months, sooner as needed.  Philmore Pali, MSN, NP-C 03/25/2014, 2:27 PM [facility] [address] [place], [place] [address] [phone]  Note: This document was prepared with digital dictation and possible smart phrase technology. Any transcriptional errors that result from this process are unintentional.

## 2014-04-13 ENCOUNTER — Telehealth: Payer: Self-pay | Admitting: Nurse Practitioner

## 2014-04-13 MED ORDER — CLOPIDOGREL BISULFATE 75 MG PO TABS: 75.0000 mg | ORAL_TABLET | Freq: Every day | ORAL | Status: DC

## 2014-04-13 NOTE — Telephone Encounter (Signed)
Rx has been sent for 90 days.  I called patient at home, line was busy.  I called cell.  Spoke with patient, he is aware.

## 2014-04-13 NOTE — Telephone Encounter (Signed)
Pt's wife called states pt would like to be able to get a 90 day supply of his clopidogrel (PLAVIX) 75 MG tablet, instead of the 30 day supply. Please call pt when called in. Thanks

## 2014-04-26 ENCOUNTER — Ambulatory Visit (INDEPENDENT_AMBULATORY_CARE_PROVIDER_SITE_OTHER): Payer: Medicare Other | Admitting: Emergency Medicine

## 2014-04-26 VITALS — BP 122/64 | HR 74 | Temp 96.7°F | Resp 20 | Ht 69.5 in | Wt 198.6 lb

## 2014-04-26 DIAGNOSIS — T24209A Burn of second degree of unspecified site of unspecified lower limb, except ankle and foot, initial encounter: Secondary | ICD-10-CM

## 2014-04-26 DIAGNOSIS — T24201A Burn of second degree of unspecified site of right lower limb, except ankle and foot, initial encounter: Secondary | ICD-10-CM

## 2014-04-26 MED ORDER — SILVER SULFADIAZINE 1 % EX CREA: 1.0000 "application " | TOPICAL_CREAM | Freq: Every day | CUTANEOUS | Status: DC
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
## 2014-04-26 NOTE — Patient Instructions (Signed)
Burn Care Your skin is a natural barrier to infection. It is the largest organ of your body. Burns damage this natural protection. To help prevent infection, it is very important to follow your caregiver's instructions in the care of your burn. Burns are classified as:  First degree. There is only redness of the skin (erythema). No scarring is expected.  Second degree. There is blistering of the skin. Scarring may occur with deeper burns.  Third degree. All layers of the skin are injured, and scarring is expected. HOME CARE INSTRUCTIONS   Wash your hands well before changing your bandage.  Change your bandage as often as directed by your caregiver.  Remove the old bandage. If the bandage sticks, you may soak it off with cool, clean water.  Cleanse the burn thoroughly but gently with mild soap and water.  Pat the area dry with a clean, dry cloth.  Apply a thin layer of antibacterial cream to the burn.  Apply a clean bandage as instructed by your caregiver.  Keep the bandage as clean and dry as possible.  Elevate the affected area for the first 24 hours, then as instructed by your caregiver.  Only take over-the-counter or prescription medicines for pain, discomfort, or fever as directed by your caregiver. SEEK IMMEDIATE MEDICAL CARE IF:   You develop excessive pain.  You develop redness, tenderness, swelling, or red streaks near the burn.  The burned area develops yellowish-white fluid (pus) or a bad smell.  You have a fever. MAKE SURE YOU:   Understand these instructions.  Will watch your condition.  Will get help right away if you are not doing well or get worse. Document Released: 10/15/2005 Document Revised: 01/07/2012 Document Reviewed: 03/07/2011 ExitCare Patient Information 2015 [employer]. This information is not intended to replace advice given to you by your health care provider. Make sure you discuss any questions you have with your health care  provider.  

## 2014-06-11 ENCOUNTER — Ambulatory Visit (INDEPENDENT_AMBULATORY_CARE_PROVIDER_SITE_OTHER): Payer: Medicare Other | Admitting: Emergency Medicine

## 2014-06-11 VITALS — BP 100/62 | HR 78 | Temp 98.2°F | Resp 18 | Ht 70.0 in | Wt 202.0 lb

## 2014-06-11 DIAGNOSIS — IMO0002 Reserved for concepts with insufficient information to code with codable children: Secondary | ICD-10-CM

## 2014-06-11 DIAGNOSIS — T24302S Burn of third degree of unspecified site of left lower limb, except ankle and foot, sequela: Secondary | ICD-10-CM

## 2014-06-11 MED ORDER — SILVER SULFADIAZINE 1 % EX CREA: 1.0000 "application " | TOPICAL_CREAM | Freq: Every day | CUTANEOUS | Status: DC

## 2014-06-11 NOTE — Patient Instructions (Signed)
Burn Care Your skin is a natural barrier to infection. It is the largest organ of your body. Burns damage this natural protection. To help prevent infection, it is very important to follow your caregiver's instructions in the care of your burn. Burns are classified as:  First degree. There is only redness of the skin (erythema). No scarring is expected.  Second degree. There is blistering of the skin. Scarring may occur with deeper burns.  Third degree. All layers of the skin are injured, and scarring is expected. HOME CARE INSTRUCTIONS   Wash your hands well before changing your bandage.  Change your bandage as often as directed by your caregiver.  Remove the old bandage. If the bandage sticks, you may soak it off with cool, clean water.  Cleanse the burn thoroughly but gently with mild soap and water.  Pat the area dry with a clean, dry cloth.  Apply a thin layer of antibacterial cream to the burn.  Apply a clean bandage as instructed by your caregiver.  Keep the bandage as clean and dry as possible.  Elevate the affected area for the first 24 hours, then as instructed by your caregiver.  Only take over-the-counter or prescription medicines for pain, discomfort, or fever as directed by your caregiver. SEEK IMMEDIATE MEDICAL CARE IF:   You develop excessive pain.  You develop redness, tenderness, swelling, or red streaks near the burn.  The burned area develops yellowish-white fluid (pus) or a bad smell.  You have a fever. MAKE SURE YOU:   Understand these instructions.  Will watch your condition.  Will get help right away if you are not doing well or get worse. Document Released: 10/15/2005 Document Revised: 01/07/2012 Document Reviewed: 03/07/2011 ExitCare Patient Information 2015 [employer]. This information is not intended to replace advice given to you by your health care provider. Make sure you discuss any questions you have with your health care  provider.  
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
## 2014-06-28 ENCOUNTER — Other Ambulatory Visit: Payer: Self-pay | Admitting: Plastic Surgery

## 2014-06-28 ENCOUNTER — Encounter (HOSPITAL_BASED_OUTPATIENT_CLINIC_OR_DEPARTMENT_OTHER): Payer: Self-pay | Admitting: *Deleted

## 2014-06-28 DIAGNOSIS — T24301D Burn of third degree of unspecified site of right lower limb, except ankle and foot, subsequent encounter: Secondary | ICD-10-CM

## 2014-06-29 ENCOUNTER — Encounter (HOSPITAL_BASED_OUTPATIENT_CLINIC_OR_DEPARTMENT_OTHER): Payer: Self-pay | Admitting: *Deleted

## 2014-06-29 NOTE — Progress Notes (Addendum)
NPO AFTER MN. ARRIVE AT 0600. NEEDS HG. CURRENT EKG IN CHART AND EPIC. WILL TAKE PREDNISONE AND SYNTHROID AM DOS W/ SIPS OF WATER.   Spoke w/ dr sanger's PA shawn. Ok for pt to stop plavix today for procedure and to start back after procedure.

## 2014-06-30 ENCOUNTER — Other Ambulatory Visit: Payer: Self-pay | Admitting: Plastic Surgery

## 2014-07-01 ENCOUNTER — Encounter (HOSPITAL_BASED_OUTPATIENT_CLINIC_OR_DEPARTMENT_OTHER)
Admission: RE | Disposition: A | Discharge status: Home / Self Care | Payer: Self-pay | Source: Ambulatory Visit | Attending: Plastic Surgery

## 2014-07-01 ENCOUNTER — Encounter (HOSPITAL_BASED_OUTPATIENT_CLINIC_OR_DEPARTMENT_OTHER): Payer: Medicare Other | Admitting: Anesthesiology

## 2014-07-01 ENCOUNTER — Ambulatory Visit (HOSPITAL_BASED_OUTPATIENT_CLINIC_OR_DEPARTMENT_OTHER): Payer: Medicare Other | Admitting: Anesthesiology

## 2014-07-01 ENCOUNTER — Encounter (HOSPITAL_BASED_OUTPATIENT_CLINIC_OR_DEPARTMENT_OTHER): Payer: Self-pay | Admitting: *Deleted

## 2014-07-01 ENCOUNTER — Ambulatory Visit (HOSPITAL_BASED_OUTPATIENT_CLINIC_OR_DEPARTMENT_OTHER)
Admission: RE | Admit: 2014-07-01 | Discharge: 2014-07-01 | Disposition: A | Discharge status: Home / Self Care | Payer: Medicare Other | Source: Ambulatory Visit | Attending: Plastic Surgery | Admitting: Plastic Surgery

## 2014-07-01 DIAGNOSIS — Z923 Personal history of irradiation: Secondary | ICD-10-CM | POA: Diagnosis not present

## 2014-07-01 DIAGNOSIS — I69959 Hemiplegia and hemiparesis following unspecified cerebrovascular disease affecting unspecified side: Secondary | ICD-10-CM | POA: Insufficient documentation

## 2014-07-01 DIAGNOSIS — T24339A Burn of third degree of unspecified lower leg, initial encounter: Secondary | ICD-10-CM | POA: Insufficient documentation

## 2014-07-01 DIAGNOSIS — Z9221 Personal history of antineoplastic chemotherapy: Secondary | ICD-10-CM | POA: Insufficient documentation

## 2014-07-01 DIAGNOSIS — T24301D Burn of third degree of unspecified site of right lower limb, except ankle and foot, subsequent encounter: Secondary | ICD-10-CM

## 2014-07-01 DIAGNOSIS — Z8546 Personal history of malignant neoplasm of prostate: Secondary | ICD-10-CM | POA: Insufficient documentation

## 2014-07-01 DIAGNOSIS — IMO0002 Reserved for concepts with insufficient information to code with codable children: Secondary | ICD-10-CM | POA: Diagnosis not present

## 2014-07-01 DIAGNOSIS — Z7902 Long term (current) use of antithrombotics/antiplatelets: Secondary | ICD-10-CM | POA: Diagnosis not present

## 2014-07-01 DIAGNOSIS — E039 Hypothyroidism, unspecified: Secondary | ICD-10-CM | POA: Insufficient documentation

## 2014-07-01 DIAGNOSIS — T24309A Burn of third degree of unspecified site of unspecified lower limb, except ankle and foot, initial encounter: Secondary | ICD-10-CM | POA: Diagnosis present

## 2014-07-01 HISTORY — DX: Disorder of arteries and arterioles, unspecified: I77.9

## 2014-07-01 HISTORY — DX: Personal history of transient ischemic attack (TIA), and cerebral infarction without residual deficits: Z86.73

## 2014-07-01 HISTORY — DX: Nonrheumatic aortic (valve) stenosis: I35.0

## 2014-07-01 HISTORY — DX: Cardiac murmur, unspecified: R01.1

## 2014-07-01 HISTORY — PX: I&D EXTREMITY: SHX5045

## 2014-07-01 HISTORY — DX: Secondary malignant neoplasm of bone: C79.51

## 2014-07-01 HISTORY — DX: Peripheral vascular disease, unspecified: I73.9

## 2014-07-01 HISTORY — DX: Burn of unspecified degree of right lower leg, initial encounter: T24.031A

## 2014-07-01 HISTORY — DX: Presence of spectacles and contact lenses: Z97.3

## 2014-07-01 HISTORY — DX: Secondary malignant neoplasm of bone: C61

## 2014-07-01 LAB — POCT HEMOGLOBIN-HEMACUE: Hemoglobin: 13.7 g/dL (ref 13.0–17.0)

## 2014-07-01 SURGERY — IRRIGATION AND DEBRIDEMENT EXTREMITY
Anesthesia: General | Site: Leg Lower | Laterality: Right

## 2014-07-01 MED ORDER — FENTANYL CITRATE 0.05 MG/ML IJ SOLN
INTRAMUSCULAR | Status: DC | PRN
  Administered 2014-07-01: 50 ug via INTRAVENOUS
  Administered 2014-07-01 (×2): 25 ug via INTRAVENOUS

## 2014-07-01 MED ORDER — FENTANYL CITRATE 0.05 MG/ML IJ SOLN
25.0000 ug | INTRAMUSCULAR | Status: DC | PRN
Start: 2014-07-01 — End: 2014-07-01
  Administered 2014-07-01: 25 ug via INTRAVENOUS
  Filled 2014-07-01: qty 1

## 2014-07-01 MED ORDER — EPHEDRINE SULFATE 50 MG/ML IJ SOLN
INTRAMUSCULAR | Status: DC | PRN
  Administered 2014-07-01: 15 mg via INTRAVENOUS

## 2014-07-01 MED ORDER — LIDOCAINE HCL (CARDIAC) 20 MG/ML IV SOLN
INTRAVENOUS | Status: DC | PRN
  Administered 2014-07-01: 80 mg via INTRAVENOUS

## 2014-07-01 MED ORDER — ONDANSETRON HCL 4 MG/2ML IJ SOLN
INTRAMUSCULAR | Status: DC | PRN
  Administered 2014-07-01: 4 mg via INTRAVENOUS

## 2014-07-01 MED ORDER — PROPOFOL 10 MG/ML IV BOLUS
INTRAVENOUS | Status: DC | PRN
  Administered 2014-07-01: 120 mg via INTRAVENOUS

## 2014-07-01 MED ORDER — CEFAZOLIN SODIUM-DEXTROSE 2-3 GM-% IV SOLR
INTRAVENOUS | Status: DC | PRN
  Administered 2014-07-01: 2 g via INTRAVENOUS

## 2014-07-01 MED ORDER — LACTATED RINGERS IV SOLN
INTRAVENOUS | Status: DC
  Administered 2014-07-01: 07:00:00 via INTRAVENOUS
  Filled 2014-07-01: qty 1000

## 2014-07-01 MED ORDER — FENTANYL CITRATE 0.05 MG/ML IJ SOLN
INTRAMUSCULAR | Status: AC
  Filled 2014-07-01: qty 2

## 2014-07-01 MED ORDER — 0.9 % SODIUM CHLORIDE (POUR BTL) OPTIME
TOPICAL | Status: DC | PRN
  Administered 2014-07-01: 1000 mL

## 2014-07-01 MED ORDER — PROMETHAZINE HCL 25 MG/ML IJ SOLN
6.2500 mg | INTRAMUSCULAR | Status: DC | PRN
  Filled 2014-07-01: qty 1

## 2014-07-01 MED ORDER — MEPERIDINE HCL 25 MG/ML IJ SOLN
6.2500 mg | INTRAMUSCULAR | Status: DC | PRN
  Filled 2014-07-01: qty 1

## 2014-07-01 MED ORDER — MIDAZOLAM HCL 2 MG/2ML IJ SOLN
INTRAMUSCULAR | Status: AC
  Filled 2014-07-01: qty 2

## 2014-07-01 MED ORDER — DEXAMETHASONE SODIUM PHOSPHATE 4 MG/ML IJ SOLN
INTRAMUSCULAR | Status: DC | PRN
  Administered 2014-07-01: 10 mg via INTRAVENOUS

## 2014-07-01 MED ORDER — SODIUM CHLORIDE 0.9 % IR SOLN
Status: DC | PRN
Start: 2014-07-01 — End: 2014-07-01
  Administered 2014-07-01: 08:00:00

## 2014-07-01 SURGICAL SUPPLY — 87 items
BAG DECANTER FOR FLEXI CONT (MISCELLANEOUS) IMPLANT
BANDAGE ELASTIC 3 VELCRO ST LF (GAUZE/BANDAGES/DRESSINGS) IMPLANT
BANDAGE ELASTIC 4 VELCRO ST LF (GAUZE/BANDAGES/DRESSINGS) ×3 IMPLANT
BENZOIN TINCTURE PRP APPL 2/3 (GAUZE/BANDAGES/DRESSINGS) IMPLANT
BLADE MINI RND TIP GREEN BEAV (BLADE) IMPLANT
BLADE SURG 10 STRL SS (BLADE) ×3 IMPLANT
BLADE SURG 15 STRL LF DISP TIS (BLADE) ×1 IMPLANT
BLADE SURG 15 STRL SS (BLADE) ×2
BNDG COHESIVE 1X5 TAN STRL LF (GAUZE/BANDAGES/DRESSINGS) IMPLANT
BNDG COHESIVE 4X5 TAN NS LF (GAUZE/BANDAGES/DRESSINGS) IMPLANT
BNDG ESMARK 4X9 LF (GAUZE/BANDAGES/DRESSINGS) IMPLANT
BNDG GAUZE ELAST 4 BULKY (GAUZE/BANDAGES/DRESSINGS) ×3 IMPLANT
CANISTER SUCT LVC 12 LTR MEDI- (MISCELLANEOUS) IMPLANT
CANISTER SUCTION 1200CC (MISCELLANEOUS) IMPLANT
CANISTER SUCTION 2500CC (MISCELLANEOUS) ×3 IMPLANT
CHLORAPREP W/TINT 26ML (MISCELLANEOUS) IMPLANT
CLOSURE WOUND 1/2 X4 (GAUZE/BANDAGES/DRESSINGS)
CLOTH BEACON ORANGE TIMEOUT ST (SAFETY) ×3 IMPLANT
CORDS BIPOLAR (ELECTRODE) IMPLANT
COVER MAYO STAND STRL (DRAPES) ×3 IMPLANT
COVER TABLE BACK 60X90 (DRAPES) ×3 IMPLANT
DECANTER SPIKE VIAL GLASS SM (MISCELLANEOUS) IMPLANT
DRAIN PENROSE 18X1/2 LTX STRL (DRAIN) IMPLANT
DRAPE EXTREMITY T 121X128X90 (DRAPE) IMPLANT
DRAPE INCISE IOBAN 66X45 STRL (DRAPES) IMPLANT
DRAPE LG THREE QUARTER DISP (DRAPES) ×3 IMPLANT
DRSG EMULSION OIL 3X3 NADH (GAUZE/BANDAGES/DRESSINGS) ×6 IMPLANT
ELECT NEEDLE BLADE 2-5/6 (NEEDLE) IMPLANT
ELECT NEEDLE TIP 2.8 STRL (NEEDLE) IMPLANT
ELECT REM PT RETURN 9FT ADLT (ELECTROSURGICAL) ×3
ELECTRODE REM PT RTRN 9FT ADLT (ELECTROSURGICAL) ×1 IMPLANT
GAUZE XEROFORM 1X8 LF (GAUZE/BANDAGES/DRESSINGS) IMPLANT
GAUZE XEROFORM 5X9 LF (GAUZE/BANDAGES/DRESSINGS) IMPLANT
GLOVE BIO SURGEON STRL SZ 6.5 (GLOVE) ×2 IMPLANT
GLOVE BIO SURGEONS STRL SZ 6.5 (GLOVE) ×1
GLOVE BIOGEL M 6.5 STRL (GLOVE) ×3 IMPLANT
GLOVE BIOGEL PI IND STRL 6.5 (GLOVE) ×1 IMPLANT
GLOVE BIOGEL PI INDICATOR 6.5 (GLOVE) ×2
GOWN PREVENTION PLUS XLARGE (GOWN DISPOSABLE) ×3 IMPLANT
GOWN STRL REUS W/TWL LRG LVL3 (GOWN DISPOSABLE) ×9 IMPLANT
HANDPIECE INTERPULSE COAX TIP (DISPOSABLE)
IV NS IRRIG 3000ML ARTHROMATIC (IV SOLUTION) IMPLANT
MATRIX SURGICAL PSM 10X15CM (Tissue) ×3 IMPLANT
MICROMATRIX 500MG (Tissue) ×3 IMPLANT
NEEDLE 27GAX1X1/2 (NEEDLE) IMPLANT
NEEDLE HYPO 30GX1 BEV (NEEDLE) IMPLANT
NS IRRIG 1000ML POUR BTL (IV SOLUTION) ×3 IMPLANT
PACK BASIN DAY SURGERY FS (CUSTOM PROCEDURE TRAY) ×3 IMPLANT
PAD ABD 8X10 STRL (GAUZE/BANDAGES/DRESSINGS) IMPLANT
PADDING CAST ABS 3INX4YD NS (CAST SUPPLIES)
PADDING CAST ABS 4INX4YD NS (CAST SUPPLIES)
PADDING CAST ABS COTTON 3X4 (CAST SUPPLIES) IMPLANT
PADDING CAST ABS COTTON 4X4 ST (CAST SUPPLIES) IMPLANT
PENCIL BUTTON HOLSTER BLD 10FT (ELECTRODE) IMPLANT
SET HNDPC FAN SPRY TIP SCT (DISPOSABLE) IMPLANT
SLEEVE SCD COMPRESS KNEE MED (MISCELLANEOUS) IMPLANT
SOLUTION PARTIC MCRMTRX 500MG (Tissue) ×1 IMPLANT
SPLINT PLASTER CAST XFAST 3X15 (CAST SUPPLIES) IMPLANT
SPLINT PLASTER XTRA FASTSET 3X (CAST SUPPLIES)
SPONGE GAUZE 4X4 12PLY STER LF (GAUZE/BANDAGES/DRESSINGS) ×3 IMPLANT
SPONGE LAP 18X18 X RAY DECT (DISPOSABLE) IMPLANT
SPONGE LAP 4X18 X RAY DECT (DISPOSABLE) ×3 IMPLANT
STAPLER VISISTAT 35W (STAPLE) ×3 IMPLANT
STOCKINETTE 4X48 STRL (DRAPES) IMPLANT
STRIP CLOSURE SKIN 1/2X4 (GAUZE/BANDAGES/DRESSINGS) IMPLANT
SUCTION FRAZIER TIP 10 FR DISP (SUCTIONS) IMPLANT
SURGILUBE 2OZ TUBE FLIPTOP (MISCELLANEOUS) ×3 IMPLANT
SUT ETHILON 3 0 PS 1 (SUTURE) IMPLANT
SUT ETHILON 4 0 P 3 18 (SUTURE) IMPLANT
SUT ETHILON 5 0 PS 2 18 (SUTURE) IMPLANT
SUT PROLENE 3 0 PS 2 (SUTURE) IMPLANT
SUT SILK 3 0 PS 1 (SUTURE) IMPLANT
SUT VIC AB 3-0 FS2 27 (SUTURE) IMPLANT
SUT VIC AB 5-0 P-3 18X BRD (SUTURE) ×2 IMPLANT
SUT VIC AB 5-0 P3 18 (SUTURE) ×4
SUT VIC AB 5-0 PS2 18 (SUTURE) IMPLANT
SYR BULB IRRIGATION 50ML (SYRINGE) IMPLANT
SYR CONTROL 10ML LL (SYRINGE) ×3 IMPLANT
TAPE HYPAFIX 6 X30' (GAUZE/BANDAGES/DRESSINGS)
TAPE HYPAFIX 6X30 (GAUZE/BANDAGES/DRESSINGS) IMPLANT
TOWEL OR 17X24 6PK STRL BLUE (TOWEL DISPOSABLE) ×3 IMPLANT
TRAY DSU PREP LF (CUSTOM PROCEDURE TRAY) ×3 IMPLANT
TUBE CONNECTING 12'X1/4 (SUCTIONS) ×1
TUBE CONNECTING 12X1/4 (SUCTIONS) ×2 IMPLANT
UNDERPAD 30X30 INCONTINENT (UNDERPADS AND DIAPERS) ×3 IMPLANT
WATER STERILE IRR 500ML POUR (IV SOLUTION) ×3 IMPLANT
YANKAUER SUCT BULB TIP NO VENT (SUCTIONS) IMPLANT

## 2014-07-01 NOTE — Anesthesia Procedure Notes (Signed)
Procedure Name: LMA Insertion Date/Time: 07/01/2014 7:30 AM Performed by: Wanita Chamberlain Pre-anesthesia Checklist: Patient identified, Timeout performed, Emergency Drugs available, Suction available and Patient being monitored Patient Re-evaluated:Patient Re-evaluated prior to inductionOxygen Delivery Method: Circle system utilized Preoxygenation: Pre-oxygenation with 100% oxygen Intubation Type: IV induction Ventilation: Mask ventilation without difficulty LMA: LMA inserted LMA Size: 4.0 Number of attempts: 1 Airway Equipment and Method: Bite block Placement Confirmation: positive ETCO2 Tube secured with: Tape Dental Injury: Teeth and Oropharynx as per pre-operative assessment
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
## 2014-07-01 NOTE — Anesthesia Preprocedure Evaluation (Addendum)
Anesthesia Evaluation    Airway       Dental no notable dental hx. (+) Dental Advisory Given, Poor Dentition   Pulmonary          Cardiovascular Exercise Tolerance: Poor + Valvular Problems/Murmurs (moderate) AS     Neuro/Psych CVA, Residual Symptoms    GI/Hepatic   Endo/Other  Hypothyroidism On Prednisone  Renal/GU      Musculoskeletal negative musculoskeletal ROS (+) Arthritis -, Osteoarthritis,    Abdominal   Peds  Hematology Prostate Cancer metastatic to bone   Anesthesia Other Findings   Reproductive/Obstetrics                        Anesthesia Physical Anesthesia Plan  ASA: III  Anesthesia Plan: General   Post-op Pain Management:    Induction: Intravenous  Airway Management Planned: LMA  Additional Equipment:   Intra-op Plan:   Post-operative Plan: Extubation in OR  Informed Consent: I have reviewed the patients History and Physical, chart, labs and discussed the procedure including the risks, benefits and alternatives for the proposed anesthesia with the patient or authorized representative who has indicated his/her understanding and acceptance.   Dental advisory given  Plan Discussed with: CRNA  Anesthesia Plan Comments:         Anesthesia Quick Evaluation

## 2014-07-01 NOTE — Progress Notes (Signed)
Dr. Migdalia Dk called to clarify when to start dressing changes and Plavix, instructed to have patient restart Plavix and dressing changes tomorrow per instructions.
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
## 2014-07-01 NOTE — Discharge Instructions (Addendum)
Apply ky gel to the center of the wound and clotrimazole to the edges daily. Follow up appointment September 9 at 2:00pm Post Anesthesia Home Care Instructions  Activity: Get plenty of rest for the remainder of the day. A responsible adult should stay with you for 24 hours following the procedure.  For the next 24 hours, DO NOT: -Drive a car -Paediatric nurse -Drink alcoholic beverages -Take any medication unless instructed by your physician -Make any legal decisions or sign important papers.  Meals: Start with liquid foods such as gelatin or soup. Progress to regular foods as tolerated. Avoid greasy, spicy, heavy foods. If nausea and/or vomiting occur, drink only clear liquids until the nausea and/or vomiting subsides. Call your physician if vomiting continues.  Special Instructions/Symptoms: Your throat may feel dry or sore from the anesthesia or the breathing tube placed in your throat during surgery. If this causes discomfort, gargle with warm salt water. The discomfort should disappear within 24 hours. Call your surgeon if you experience:   1.  Fever over 101.0. 2.  Inability to urinate. 3.  Nausea and/or vomiting. 4.  Extreme swelling or bruising at the surgical site. 5.  Continued bleeding from the incision. 6.  Increased pain, redness or drainage from the incision. 7.  Problems related to your pain medication. 8. Any change in color, movement and/or sensation 9. Any problems and/or concerns
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
## 2014-07-01 NOTE — H&P (Signed)
Richard Jacobs is an 80 y.o. male.   Chief Complaint: leg burn HPI: The patient is an 44 yrs wm here for evaluation of a right leg burn. He sustained the burn over 2 months ago. He was seen at the urgent care and given silvadene to place on the area. He has been putting the cream on it daily. It was ~ 10 x 8 cm and is now 4 x 5 cm. It has a portion in the center that is still a full thickness burn. The outer border is red but it does not look infected.   Past Medical History  Diagnosis Date  . Hypothyroidism   . History of right MCA stroke     12/ 2014--  residual leg weakness  . Prostate cancer metastatic to bone oncologist-- dr Richard Jacobs ([facility])  multifocal osseous metastatic bone     original dx 1995 s/p  radical prostatectomy with node dissection (positive margins, negative nodes) /  1997 biochemical recurrence s/p  external beam radiation/  chemotherapy 03/ 2014/  mets to spine and ribs  . Mild carotid artery disease     bilateral per duplex 09/2013  1-39%  . Burn of right lower leg   . Heart murmur   . Moderate aortic stenosis   . Wears glasses     Past Surgical History  Procedure Laterality Date  . Orchiectomy Bilateral 02-02-2004  . Retropubic prostatectomy  1995    w/  bilateral pelvic lymph node dissection  . Inguinal hernia repair Right 02-24-2002  . Transthoracic echocardiogram  10-03-2013    mild LVH/  ef 09-98%/  grade I diastolic dysfunction/  moderate AV  calcification with moderate stenosis/  moderate to severe calcification with mild MV stenosis and regurg./  mild LAE/  mild dilated RV    Family History  Problem Relation Age of Onset  . Liver cancer Mother   . Breast cancer Sister    Social History:  reports that he has never smoked. His smokeless tobacco use includes Snuff. He reports that he drinks alcohol. He reports that he does not use illicit drugs.  Allergies: No Known Allergies  Medications Prior to Admission  Medication Sig Dispense  Refill  . clopidogrel (PLAVIX) 75 MG tablet Take 1 tablet (75 mg total) by mouth daily.  90 tablet  1  . levothyroxine (SYNTHROID, LEVOTHROID) 88 MCG tablet Take 88 mcg by mouth daily before breakfast.      . predniSONE (DELTASONE) 5 MG tablet Take 5 mg by mouth 2 (two) times daily with a meal.      . silver sulfADIAZINE (SILVADENE) 1 % cream Apply 1 application topically daily.  100 g  1  . ZYTIGA 250 MG tablet Take 1,000 mg by mouth every evening.         Results for orders placed during the hospital encounter of 07/01/14 (from the past 48 hour(s))  POCT HEMOGLOBIN-HEMACUE     Status: None   Collection Time    07/01/14  7:02 AM      Result Value Ref Range   Hemoglobin 13.7  13.0 - 17.0 g/dL   No results found.  Review of Systems  Constitutional: Negative.   HENT: Negative.   Eyes: Negative.   Respiratory: Negative.   Cardiovascular: Negative.   Gastrointestinal: Negative.   Genitourinary: Negative.   Musculoskeletal: Negative.   Skin: Negative.   Psychiatric/Behavioral: Negative.     Blood pressure 171/87, pulse 71, temperature 98.8 F (37.1 C), temperature source Oral,  resp. rate 16, weight 92.08 kg (203 lb), SpO2 98.00%. Physical Exam  Constitutional: He is oriented to person, place, and time. He appears well-developed and well-nourished.  HENT:  Head: Normocephalic and atraumatic.  Eyes: Conjunctivae and EOM are normal. Pupils are equal, round, and reactive to light.  Cardiovascular: Normal rate.   Respiratory: Effort normal.  Musculoskeletal: Normal range of motion.  Neurological: He is alert and oriented to person, place, and time.  Skin: Skin is warm.  Psychiatric: He has a normal mood and affect. His behavior is normal. Judgment and thought content normal.     Assessment/Plan Debridement of leg burn on right with placement of Acell.  Jacobs,Richard 07/01/2014, 7:23 AM

## 2014-07-06 ENCOUNTER — Encounter (HOSPITAL_BASED_OUTPATIENT_CLINIC_OR_DEPARTMENT_OTHER): Payer: Self-pay | Admitting: Plastic Surgery

## 2014-09-08 ENCOUNTER — Encounter: Payer: Self-pay | Admitting: Neurology

## 2014-09-27 ENCOUNTER — Ambulatory Visit: Payer: Medicare Other | Admitting: Nurse Practitioner

## 2014-10-20 ENCOUNTER — Other Ambulatory Visit: Payer: Self-pay | Admitting: Nurse Practitioner

## 2014-11-30 ENCOUNTER — Ambulatory Visit (INDEPENDENT_AMBULATORY_CARE_PROVIDER_SITE_OTHER): Payer: Medicare Other | Admitting: Neurology

## 2014-11-30 ENCOUNTER — Encounter: Payer: Self-pay | Admitting: Neurology

## 2014-11-30 VITALS — BP 126/69 | HR 82 | Ht 70.0 in | Wt 211.0 lb

## 2014-11-30 DIAGNOSIS — G620 Drug-induced polyneuropathy: Secondary | ICD-10-CM | POA: Insufficient documentation

## 2014-11-30 DIAGNOSIS — R2681 Unsteadiness on feet: Secondary | ICD-10-CM

## 2014-11-30 DIAGNOSIS — T451X5A Adverse effect of antineoplastic and immunosuppressive drugs, initial encounter: Principal | ICD-10-CM

## 2014-11-30 NOTE — Patient Instructions (Signed)
I had a long discussion with the patient and his wife regarding his new complaints of gait difficulties and leg weakness likely related to peripheral neuropathy. Etiology to determine but certainly chemotherapy side effect is a possibility. Plan to check neuropathy panel labs and no conduction EMG study. I advised the patient to discuss whether he can come off Society the with his oncologist at the visit next week at [facility]. I also advised him to follow fall precautions. Continue Plavix for stroke prevention with strict control of hypertension and blood pressure goal below 130/90. Strict control of lipids with LDL cholesterol goal below 70 mg percent. Return for follow-up in 3 months or call earlier if necessary  Fall Prevention and [place] cause injuries and can affect all age groups. It is possible to use preventive measures to significantly decrease the likelihood of falls. There are many simple measures which can make your home safer and prevent falls. OUTDOORS  Repair cracks and edges of walkways and driveways.  Remove high doorway thresholds.  Trim shrubbery on the main path into your home.  Have good outside lighting.  Clear walkways of tools, rocks, debris, and clutter.  Check that handrails are not broken and are securely fastened. Both sides of steps should have handrails.  Have leaves, snow, and ice cleared regularly.  Use sand or salt on walkways during winter months.  In the garage, clean up grease or oil spills. BATHROOM  Install night lights.  Install grab bars by the toilet and in the tub and shower.  Use non-skid mats or decals in the tub or shower.  Place a plastic non-slip stool in the shower to sit on, if needed.  Keep floors dry and clean up all water on the floor immediately.  Remove soap buildup in the tub or shower on a regular basis.  Secure bath mats with non-slip, double-sided rug tape.  Remove throw rugs and tripping hazards from the  floors. BEDROOMS  Install night lights.  Make sure a bedside light is easy to reach.  Do not use oversized bedding.  Keep a telephone by your bedside.  Have a firm chair with side arms to use for getting dressed.  Remove throw rugs and tripping hazards from the floor. KITCHEN  Keep handles on pots and pans turned toward the center of the stove. Use back burners when possible.  Clean up spills quickly and allow time for drying.  Avoid walking on wet floors.  Avoid hot utensils and knives.  Position shelves so they are not too high or low.  Place commonly used objects within easy reach.  If necessary, use a sturdy step stool with a grab bar when reaching.  Keep electrical cables out of the way.  Do not use floor polish or wax that makes floors slippery. If you must use wax, use non-skid floor wax.  Remove throw rugs and tripping hazards from the floor. STAIRWAYS  Never leave objects on stairs.  Place handrails on both sides of stairways and use them. Fix any loose handrails. Make sure handrails on both sides of the stairways are as long as the stairs.  Check carpeting to make sure it is firmly attached along stairs. Make repairs to worn or loose carpet promptly.  Avoid placing throw rugs at the top or bottom of stairways, or properly secure the rug with carpet tape to prevent slippage. Get rid of throw rugs, if possible.  Have an electrician put in a light switch at the top and  bottom of the stairs. OTHER FALL PREVENTION TIPS  Wear low-heel or rubber-soled shoes that are supportive and fit well. Wear closed toe shoes.  When using a stepladder, make sure it is fully opened and both spreaders are firmly locked. Do not climb a closed stepladder.  Add color or contrast paint or tape to grab bars and handrails in your home. Place contrasting color strips on first and last steps.  Learn and use mobility aids as needed. Install an electrical emergency response  system.  Turn on lights to avoid dark areas. Replace light bulbs that burn out immediately. Get light switches that glow.  Arrange furniture to create clear pathways. Keep furniture in the same place.  Firmly attach carpet with non-skid or double-sided tape.  Eliminate uneven floor surfaces.  Select a carpet pattern that does not visually hide the edge of steps.  Be aware of all pets. OTHER HOME SAFETY TIPS  Set the water temperature for 120 F (48.8 C).  Keep emergency numbers on or near the telephone.  Keep smoke detectors on every level of the home and near sleeping areas. Document Released: 10/05/2002 Document Revised: 04/15/2012 Document Reviewed: 01/04/2012 [facility] Patient Information 2015 [place], [place]. This information is not intended to replace advice given to you by your health care provider. Make sure you discuss any questions you have with your health care provider.

## 2014-11-30 NOTE — Progress Notes (Signed)
PATIENT: Richard Jacobs DOB: Dec 11, 1927  REASON FOR VISIT: routine stroke follow up HISTORY FROM: patient  HISTORY OF PRESENT ILLNESS: 14 year Caucasian male who developed left lower lip numbness and gait imbalance night of 10/01/13 but did not seek help until next day when he developed slurred speech and left facial droop.  I have reviewed his hospital admission records. CT head showed no acute abnormalities and MRI brain personally reviewed by me showed patchy right parietal subcortical and cortical small infarct. 2DEcho and carotid dopplers were unremarkable and MRA brain showed no large vessel stenosis. He was seen by neurohospitalist and started on aspirin and plavix and has done well and symptoms mostly resolved at discharge. He has finished outpatient therapies and has been tolerating lipitor without myalgia's and dual antiplatelet therapy without bleeding or bruising. He does notice some drooling from left corner of mouth at night upon awakening in am.  He has been doing exercises taught to him by speech therapy regularly.  He states his blood pressure is well controlled and it is 110/63 in our office.  He has h/o melanomas and is scheduled to have one removed from his left foot soon and is concerned about stopping antiplatelet and stroke risk.  He has no prior h/o stroke, TIA, seizures or significant neurological problems.   UPDATE 03/25/14 (LL): Since last visit, patient has been doing well.  He is staying active.  He does not feel as strong in his legs that he did before stroke, but he is able to do everything he wants to do.  He is tolerating Plavix well with no signs of significant bleeding or bruising.  His blood pressure is well controlled, it is 120/65 in our office.  He is undergoing treatment for prostate cancer, with Zytiga.  Treatments are going well so far. He has no complaints today. Update 11/30/2014 : He returns for follow-up after last visit 7 months ago. He is accompanied by  his wife who stated that his notice difficulty walking with weakness in his legs beneath the knees as well as numbness ever since he was started on chemotherapy Zytiga for his prostate cancer and prednisone 6 months ago. He has not had any major falls but has to be careful when he gets up and walks particularly in the dark. He denies any back pain or radicular pain. He had discontinued Lipitor after the last visit and his leg aches and pains improved. He has noticed that the speed of walking is quite diminished and he has at times dragging his feet are long and he is constantly scared that he will fall. He has an appointment to see his oncologist at [facility] next week for prostate cancer. He has not had any recurrent stroke or TIA symptoms since December 2014 and remains on Plavix which is tolerating well with only minor bruising.  ROS:  14 system review of systems is positive for leg weakness, gait difficulty, balance difficulties, easy bruisability, leg numbness and all other systems negative  ALLERGIES: No Known Allergies  HOME MEDICATIONS: Outpatient Encounter Prescriptions as of 11/30/2014  Medication Sig Note  . clopidogrel (PLAVIX) 75 MG tablet TAKE 1 TABLET (75 MG TOTAL) BY MOUTH DAILY.   Marland Kitchen levothyroxine (SYNTHROID, LEVOTHROID) 100 MCG tablet Take 100 mcg by mouth daily. 11/30/2014: Received from: External Pharmacy Received Sig:   . predniSONE (DELTASONE) 5 MG tablet Take 5 mg by mouth 2 (two) times daily with a meal.   . ZYTIGA 250 MG tablet Take  1,000 mg by mouth every evening.  03/25/2014: Received from: External Pharmacy  . [DISCONTINUED] levothyroxine (SYNTHROID, LEVOTHROID) 88 MCG tablet Take 88 mcg by mouth daily before breakfast.   . [DISCONTINUED] silver sulfADIAZINE (SILVADENE) 1 % cream Apply 1 application topically daily.      PHYSICAL EXAM  Filed Vitals:   11/30/14 1351  BP: 126/69  Pulse: 82  Height: 5\' 10"  (1.778 m)  Weight: 211 lb (95.709 kg)   Body mass index is 30.28  kg/(m^2). No exam data present   Physical Exam  General: well developed, well nourished Caucasian male, seated, in no evident distress  Head: head normocephalic and atraumatic. Orohparynx benign  Neck: supple with no carotid or supraclavicular bruits  Cardiovascular: regular rate and rhythm,soft ejection murmur 2/6   Musculoskeletal: no deformity  Skin: no rash/petichiae  Vascular: Normal pulses all extremities   Neurologic Exam  Mental Status: Awake and fully alert. Oriented to place and time. Recent and remote memory intact. Attention span, concentration and fund of knowledge appropriate. Mood and affect appropriate.  Cranial Nerves: Pupils equal, briskly reactive to light. Extraocular movements full without nystagmus. Visual fields full to confrontation. Hearing intact. Facial sensation intact. Mild lower left Face weak., tongue, palate moves normally and symmetrically.  Motor: Normal bulk and tone. Normal strength in all tested extremity muscles. Diminished fine finger movements on left. Orbits right over left upper extremity.  Sensory:diminished light touch and pinprick sensation in both legs from ankle down. Impaired vibration and position sense from ankle down bilaterally. Romberg's is weakly positive. Coordination: Rapid alternating movements normal in all extremities. Finger-to-nose and heel-to-shin performed accurately bilaterally.  Gait and Station: Arises from chair without difficulty. Stance is normal. Gait demonstrates normal stride length and balance . Unable to heel, toe and tandem walk without difficulty.  Reflexes: 1+ and symmetric except right ankle jerk is depressed.. Toes downgoing.   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   PLAN:  I had a long discussion with the patient and his wife regarding his new complaints of gait difficulties and leg  weakness likely related to peripheral neuropathy. Etiology to determine but certainly chemotherapy side effect is a possibility. Plan to check neuropathy panel labs and no conduction EMG study. I advised the patient to discuss whether he can come off Society the with his oncologist at the visit next week at [facility]. I also advised him to follow fall precautions. Continue Plavix for stroke prevention with strict control of hypertension and blood pressure goal below 130/90. Strict control of lipids with LDL cholesterol goal below 70 mg percent. Return for follow-up in 3 months or call earlier if necessary  Antony Contras, MD  11/30/2014, 2:42 PM [facility] [address], [place], [place] [address] [phone]  Note: This document was prepared with digital dictation and possible smart phrase technology. Any transcriptional errors that result from this process are unintentional.

## 2014-12-02 LAB — NEUROPATHY PANEL
A/G Ratio: 1.1 (ref 0.7–2.0)
Albumin ELP: 3.3 g/dL (ref 3.2–5.6)
Alpha 1: 0.2 g/dL (ref 0.1–0.4)
Alpha 2: 0.7 g/dL (ref 0.4–1.2)
Angio Convert Enzyme: 25 U/L (ref 14–82)
Anti Nuclear Antibody(ANA): POSITIVE — AB
Beta: 1.1 g/dL (ref 0.6–1.3)
GLOBULIN, TOTAL: 2.9 g/dL (ref 2.0–4.5)
Gamma Globulin: 0.9 g/dL (ref 0.5–1.6)
RHEUMATOID FACTOR: 9.3 [IU]/mL (ref 0.0–13.9)
Sed Rate: 4 mm/hr (ref 0–30)
TSH: 2.95 u[IU]/mL (ref 0.450–4.500)
Total Protein: 6.2 g/dL (ref 6.0–8.5)
Vit D, 25-Hydroxy: 21.1 ng/mL — ABNORMAL LOW (ref 30.0–100.0)
Vitamin B-12: 288 pg/mL (ref 211–946)

## 2014-12-06 ENCOUNTER — Encounter: Payer: Self-pay | Admitting: Neurology

## 2014-12-06 ENCOUNTER — Ambulatory Visit (INDEPENDENT_AMBULATORY_CARE_PROVIDER_SITE_OTHER): Payer: Medicare Other | Admitting: Neurology

## 2014-12-06 ENCOUNTER — Ambulatory Visit (INDEPENDENT_AMBULATORY_CARE_PROVIDER_SITE_OTHER): Payer: Self-pay | Admitting: Neurology

## 2014-12-06 DIAGNOSIS — T451X5A Adverse effect of antineoplastic and immunosuppressive drugs, initial encounter: Secondary | ICD-10-CM

## 2014-12-06 DIAGNOSIS — G620 Drug-induced polyneuropathy: Secondary | ICD-10-CM

## 2014-12-06 DIAGNOSIS — R2681 Unsteadiness on feet: Secondary | ICD-10-CM

## 2014-12-06 DIAGNOSIS — G609 Hereditary and idiopathic neuropathy, unspecified: Secondary | ICD-10-CM

## 2014-12-06 DIAGNOSIS — G5601 Carpal tunnel syndrome, right upper limb: Secondary | ICD-10-CM

## 2014-12-06 NOTE — Progress Notes (Signed)
Please refer to EMG and nerve conduction study procedure note. 

## 2014-12-06 NOTE — Procedures (Signed)
     HISTORY:   Richard Jacobs is an 81 year old gentleman with a history of numbness that has developed below the knees in both legs within the last one year. He denies any significant pain associated with this numbness. He has developed a progressive gait disorder, he reports no recent falls. He is being evaluated for a possible neuropathy or a lumbosacral radiculopathy.  NERVE CONDUCTION STUDIES:  Nerve conduction studies were performed on the right upper extremity. The distal motor latency for the right median nerve was prolonged, with a normal motor amplitude. The distal motor latency and motor amplitudes for the right ulnar nerve were normal. The F wave latency for the right median nerve was prolonged, normal for the right ulnar nerve. The sensory latency for the right median nerve was prolonged , normal for the right ulnar nerve.   Nerve conduction studies were performed on both lower extremities. The distal motor latencies for the peroneal nerves were borderline normal on the right, normal on the left, with low motor amplitudes for these nerves bilaterally. The distal motor latencies for the posterior tibial nerves were normal bilaterally , with low motor amplitudes for these nerves bilaterally. The  Nerve conduction velocities for the peroneal and posterior tibial nerves were normal bilaterally , with absence of the peroneal sensory latencies bilaterally. The H reflex latencies were absent bilaterally.  EMG STUDIES:  EMG study was performed on the right lower extremity:  The tibialis anterior muscle reveals 2 to 5K motor units with  decreased recruitment. No fibrillations or positive waves were seen. The peroneus tertius muscle reveals 2 to 6K motor units with  Moderately decreased recruitment. No fibrillations or positive waves were seen. The medial gastrocnemius muscle reveals 2 to 4K motor units with  decrased recruitment. No fibrillations or positive waves were seen. The vastus  lateralis muscle reveals 2 to 4K motor units with full recruitment. No fibrillations or positive waves were seen. The iliopsoas muscle reveals 2 to 4K motor units with full recruitment. No fibrillations or positive waves were seen. The biceps femoris muscle (long head) reveals 2 to 4K motor units with full recruitment. No fibrillations or positive waves were seen. The lumbosacral paraspinal muscles were tested at 3 levels, and revealed no abnormalities of insertional activity at all 3 levels tested. There was good relaxation.   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 There is evidence of a mild overlying right carpal tunnel syndrome. EMG evaluation of the right lower extremity shows findings of chronic stable distal denervation consistent with the diagnosis of a peripheral neuropathy. There is no evidence of an overlying lumbosacral radiculopathy.  Jill Alexanders MD 12/06/2014 2:07 PM  [facility] [address] [place], [place] [address]  Phone [phone] Fax [phone]

## 2014-12-09 ENCOUNTER — Telehealth: Payer: Self-pay | Admitting: Neurology

## 2015-01-17 ENCOUNTER — Other Ambulatory Visit: Payer: Self-pay | Admitting: Neurology

## 2015-02-28 ENCOUNTER — Ambulatory Visit: Payer: Medicare Other | Admitting: Neurology

## 2015-03-10 ENCOUNTER — Encounter: Payer: Self-pay | Admitting: Neurology

## 2015-03-10 ENCOUNTER — Ambulatory Visit (INDEPENDENT_AMBULATORY_CARE_PROVIDER_SITE_OTHER): Payer: Medicare Other | Admitting: Neurology

## 2015-03-10 VITALS — BP 121/74 | HR 88 | Wt 211.0 lb

## 2015-03-10 DIAGNOSIS — R2681 Unsteadiness on feet: Secondary | ICD-10-CM

## 2015-03-10 NOTE — Patient Instructions (Signed)
I had a long discussion with the patient and his wife regarding his gait difficulties likely related to peripheral neuropathy and explained and reviewed available lab and nerve conduction results and answered questions .Exact etiology of neuropathy is not determined but probably related to chemotherapy effect outside the graft. I advised him about fall and safety precautions and will refer him to physical therapy for gait and balance training. I also strongly encourage him to see his primary physician Dr. Reynaldo Minium to start treatment for low vitamin D levels. We also discussed the positive ANA is likely a nonspecific finding as he lacks clinical signs of rheumatological disorder. I also encouraged him to discuss with his oncologist either reducing or changing his chemotherapy agent. I advised him to use his cane at all times and fall and safety precautions. He does not have significant pain or paresthesias to justify medications for that. Return for follow-up in 3 months or call earlier if necessary.  Fall Prevention and Home Safety Falls cause injuries and can affect all age groups. It is possible to use preventive measures to significantly decrease the likelihood of falls. There are many simple measures which can make your home safer and prevent falls. OUTDOORS  Repair cracks and edges of walkways and driveways.  Remove high doorway thresholds.  Trim shrubbery on the main path into your home.  Have good outside lighting.  Clear walkways of tools, rocks, debris, and clutter.  Check that handrails are not broken and are securely fastened. Both sides of steps should have handrails.  Have leaves, snow, and ice cleared regularly.  Use sand or salt on walkways during winter months.  In the garage, clean up grease or oil spills. BATHROOM  Install night lights.  Install grab bars by the toilet and in the tub and shower.  Use non-skid mats or decals in the tub or shower.  Place a plastic  non-slip stool in the shower to sit on, if needed.  Keep floors dry and clean up all water on the floor immediately.  Remove soap buildup in the tub or shower on a regular basis.  Secure bath mats with non-slip, double-sided rug tape.  Remove throw rugs and tripping hazards from the floors. BEDROOMS  Install night lights.  Make sure a bedside light is easy to reach.  Do not use oversized bedding.  Keep a telephone by your bedside.  Have a firm chair with side arms to use for getting dressed.  Remove throw rugs and tripping hazards from the floor. KITCHEN  Keep handles on pots and pans turned toward the center of the stove. Use back burners when possible.  Clean up spills quickly and allow time for drying.  Avoid walking on wet floors.  Avoid hot utensils and knives.  Position shelves so they are not too high or low.  Place commonly used objects within easy reach.  If necessary, use a sturdy step stool with a grab bar when reaching.  Keep electrical cables out of the way.  Do not use floor polish or wax that makes floors slippery. If you must use wax, use non-skid floor wax.  Remove throw rugs and tripping hazards from the floor. STAIRWAYS  Never leave objects on stairs.  Place handrails on both sides of stairways and use them. Fix any loose handrails. Make sure handrails on both sides of the stairways are as long as the stairs.  Check carpeting to make sure it is firmly attached along stairs. Make repairs to worn or loose  carpet promptly.  Avoid placing throw rugs at the top or bottom of stairways, or properly secure the rug with carpet tape to prevent slippage. Get rid of throw rugs, if possible.  Have an electrician put in a light switch at the top and bottom of the stairs. OTHER FALL PREVENTION TIPS  Wear low-heel or rubber-soled shoes that are supportive and fit well. Wear closed toe shoes.  When using a stepladder, make sure it is fully opened and both  spreaders are firmly locked. Do not climb a closed stepladder.  Add color or contrast paint or tape to grab bars and handrails in your home. Place contrasting color strips on first and last steps.  Learn and use mobility aids as needed. Install an electrical emergency response system.  Turn on lights to avoid dark areas. Replace light bulbs that burn out immediately. Get light switches that glow.  Arrange furniture to create clear pathways. Keep furniture in the same place.  Firmly attach carpet with non-skid or double-sided tape.  Eliminate uneven floor surfaces.  Select a carpet pattern that does not visually hide the edge of steps.  Be aware of all pets. OTHER HOME SAFETY TIPS  Set the water temperature for 120 F (48.8 C).  Keep emergency numbers on or near the telephone.  Keep smoke detectors on every level of the home and near sleeping areas. Document Released: 10/05/2002 Document Revised: 04/15/2012 Document Reviewed: 01/04/2012 [facility] Patient Information 2015 [place], [place]. This information is not intended to replace advice given to you by your health care provider. Make sure you discuss any questions you have with your health care provider.

## 2015-03-10 NOTE — Progress Notes (Signed)
PATIENT: Richard Jacobs Sick DOB: 08/20/1928  REASON FOR VISIT: routine stroke follow up HISTORY FROM: patient  HISTORY OF PRESENT ILLNESS: 81 year Caucasian male who developed left lower lip numbness and gait imbalance night of 10/01/13 but did not seek help until next day when he developed slurred speech and left facial droop.  I have reviewed his hospital admission records. CT head showed no acute abnormalities and MRI brain personally reviewed by me showed patchy right parietal subcortical and cortical small infarct. 2DEcho and carotid dopplers were unremarkable and MRA brain showed no large vessel stenosis. He was seen by neurohospitalist and started on aspirin and plavix and has done well and symptoms mostly resolved at discharge. He has finished outpatient therapies and has been tolerating lipitor without myalgia's and dual antiplatelet therapy without bleeding or bruising. He does notice some drooling from left corner of mouth at night upon awakening in am.  He has been doing exercises taught to him by speech therapy regularly.  He states his blood pressure is well controlled and it is 110/63 in our office.  He has h/o melanomas and is scheduled to have one removed from his left foot soon and is concerned about stopping antiplatelet and stroke risk.  He has no prior h/o stroke, TIA, seizures or significant neurological problems.   UPDATE 03/25/14 (LL): Since last visit, patient has been doing well.  He is staying active.  He does not feel as strong in his legs that he did before stroke, but he is able to do everything he wants to do.  He is tolerating Plavix well with no signs of significant bleeding or bruising.  His blood pressure is well controlled, it is 120/65 in our office.  He is undergoing treatment for prostate cancer, with Zytiga.  Treatments are going well so far. He has no complaints today. Update 79/11/2014 : He returns for follow-up after last visit 7 months ago. He is accompanied by  his wife who stated that his notice difficulty walking with weakness in his legs beneath the knees as well as numbness ever since he was started on chemotherapy Zytiga for his prostate cancer and prednisone 6 months ago. He has not had any major falls but has to be careful when he gets up and walks particularly in the dark. He denies any back pain or radicular pain. He had discontinued Lipitor after the last visit and his leg aches and pains improved. He has noticed that the speed of walking is quite diminished and he has at times dragging his feet are long and he is constantly scared that he will fall. He has an appointment to see his oncologist at [facility] next week for prostate cancer. He has not had any recurrent stroke or TIA symptoms since December 2014 and remains on Plavix which is tolerating well with only minor bruising.  Update 79/09/2015 : He returns for follow-up after last visit to 3 months ago. He continues to have gait and balance difficulties but no paresthesias and burning in his feet. He reports decreased strength from knee down. He has had no falls as he is quite careful. He had EMG conduction study done on 2/8 at 16 by Dr. 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 He denies any pain in his right hand, tingling numbness or difficulty using it. Lab work done on 11/30/14 shows normal ESR, TSH, angiotensin-converting enzyme levels. Vitamin D level is low at 21.1  and ANA is positive. Patient denies any history suggestive of rheumatological disorder in the form of arthralgias, myalgias or rash. Serum protein electrophoresis is also normal. The patient was advised to see Dr. Reynaldo Minium to get treatment for low vitamin D but he has not done that yet. ROS:  14 system review of systems is positive for leg weakness, gait difficulty, balance difficulties, easy bruisability, leg numbness and all other systems negative  ALLERGIES: No Known  Allergies  HOME MEDICATIONS: Outpatient Encounter Prescriptions as of 03/10/2015  . Order #: [id number] Class: Normal  . Order #: [id number] Class: Historical Med  . Order #: [id number] Class: Historical Med  . Order #: [id number] Class: Historical Med     PHYSICAL EXAM  Filed Vitals:   03/10/15 0847  BP: 121/74  Pulse: 88  Weight: 211 lb (95.709 kg)   Body mass index is 30.28 kg/(m^2). No exam data present   Physical Exam  General: well developed, well nourished Caucasian male, seated, in no evident distress  Head: head normocephalic and atraumatic.    Neck: supple with no carotid or supraclavicular bruits  Cardiovascular: regular rate and rhythm,soft ejection murmur 2/6   Musculoskeletal: no deformity  Skin: no rash/petichiae  Vascular: Normal pulses all extremities   Neurologic Exam  Mental Status: Awake and fully alert. Oriented to place and time. Recent and remote memory intact. Attention span, concentration and fund of knowledge appropriate. Mood and affect appropriate.  Cranial Nerves: Pupils equal, briskly reactive to light. Extraocular movements full without nystagmus. Visual fields full to confrontation. Hearing intact. Facial sensation intact. Mild lower left Face weak., tongue, palate moves normally and symmetrically.  Motor: Normal bulk and tone. Normal strength in all tested extremity muscles. Diminished fine finger movements on left. Orbits right over left upper extremity.  Sensory:diminished light touch and pinprick sensation in both legs from ankle down. Impaired vibration and position sense from ankle down bilaterally. Romberg's is weakly positive. Coordination: Rapid alternating movements normal in all extremities. Finger-to-nose and heel-to-shin performed accurately bilaterally.  Gait and Station: Arises from chair without difficulty. Stance is normal. Gait demonstrates normal stride length and balance . Unable to heel, toe and tandem walk without difficulty.    Reflexes: 1+ and symmetric except right ankle jerk is depressed.. Toes downgoing.   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 Axonal peripheral neuropathy of unknown etiology with negative evaluation for treatable causes except low vitamin D levels   PLAN:   I had a long discussion with the patient and his wife regarding his gait difficulties likely related to peripheral neuropathy and explained and reviewed available lab and nerve conduction results and answered questions .Exact etiology of neuropathy is not determined but probably related to remote cancer effect  Or low vitamin d levels. I advised him about fall and safety precautions and will refer him to physical therapy for gait and balance training. I also strongly encourage him to see his primary physician Dr. Reynaldo Minium to start treatment for low vitamin D levels. We also discussed the positive ANA is likely a nonspecific finding as he lacks clinical signs of rheumatological disorder. I also encouraged him to discuss with his oncologist either reducing or changing his chemotherapy agent. I advised him to use his cane at all times and fall and safety precautions. He does not have significant pain or paresthesias to justify medications for that. Return for follow-up in 3  months or call earlier if necessary.   Antony Contras, MD  03/10/2015, 5:39 PM [facility] [address] [place], [place] [address] [phone]  Note: This document was prepared with digital dictation and possible smart phrase technology. Any transcriptional errors that result from this process are unintentional.

## 2015-03-16 ENCOUNTER — Telehealth: Payer: Self-pay | Admitting: Neurology

## 2015-04-07 NOTE — Telephone Encounter (Signed)
Error

## 2015-04-12 ENCOUNTER — Encounter: Payer: Self-pay | Admitting: Physical Therapy

## 2015-04-12 ENCOUNTER — Ambulatory Visit: Payer: Medicare Other | Attending: Neurology | Admitting: Physical Therapy

## 2015-04-12 DIAGNOSIS — R269 Unspecified abnormalities of gait and mobility: Secondary | ICD-10-CM | POA: Insufficient documentation

## 2015-04-12 DIAGNOSIS — R6889 Other general symptoms and signs: Secondary | ICD-10-CM | POA: Diagnosis not present

## 2015-04-12 DIAGNOSIS — R29818 Other symptoms and signs involving the nervous system: Secondary | ICD-10-CM | POA: Diagnosis not present

## 2015-04-12 DIAGNOSIS — R29898 Other symptoms and signs involving the musculoskeletal system: Secondary | ICD-10-CM

## 2015-04-12 DIAGNOSIS — R259 Unspecified abnormal involuntary movements: Secondary | ICD-10-CM | POA: Diagnosis not present

## 2015-04-12 DIAGNOSIS — R2689 Other abnormalities of gait and mobility: Secondary | ICD-10-CM
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
## 2015-04-14 ENCOUNTER — Other Ambulatory Visit: Payer: Self-pay | Admitting: Neurology

## 2015-04-14 ENCOUNTER — Ambulatory Visit: Payer: Medicare Other | Admitting: Physical Therapy

## 2015-04-14 ENCOUNTER — Encounter: Payer: Self-pay | Admitting: Physical Therapy

## 2015-04-14 DIAGNOSIS — R269 Unspecified abnormalities of gait and mobility: Secondary | ICD-10-CM | POA: Diagnosis not present

## 2015-04-14 DIAGNOSIS — R6889 Other general symptoms and signs: Secondary | ICD-10-CM

## 2015-04-14 DIAGNOSIS — R29898 Other symptoms and signs involving the musculoskeletal system: Secondary | ICD-10-CM

## 2015-04-14 DIAGNOSIS — R2689 Other abnormalities of gait and mobility: Secondary | ICD-10-CM

## 2015-04-14 DIAGNOSIS — H832X9 Labyrinthine dysfunction, unspecified ear: Secondary | ICD-10-CM

## 2015-04-14 NOTE — Patient Instructions (Addendum)
Feet Apart, Head Motion - Eyes Open   With eyes open, feet apart, move head slowly: right /left, up/ down, up-right / down-left, and up-left / down-right. Repeat __10-15__ times each direction per session. Do __1-3__ sessions per day.  Copyright  VHI. All rights reserved.  Feet Apart, Head Motion - Eyes Closed   With eyes closed and feet shoulder width apart, move head slowly, right /left, up/ down, up-right / down-left, and up-left / down-right. Repeat __10-15__ times per session. Do __1-3__ sessions per day.  Copyright  VHI. All rights reserved.  Feet Apart (Compliant Surface) Head Motion - Eyes Open   With eyes open, standing on compliant surface: ___pillow or foam_____, feet shoulder width apart, move head slowly: right /left, up/ down, up-right / down-left, and up-left / down-right. Repeat __10-15__ times per session. Do __1-3__ sessions per day.  Copyright  VHI. All rights reserved.
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
## 2015-04-19 ENCOUNTER — Ambulatory Visit: Payer: Medicare Other | Admitting: Physical Therapy

## 2015-04-20 ENCOUNTER — Ambulatory Visit: Payer: Medicare Other | Admitting: Physical Therapy

## 2015-04-20 DIAGNOSIS — R269 Unspecified abnormalities of gait and mobility: Secondary | ICD-10-CM | POA: Diagnosis not present

## 2015-04-20 DIAGNOSIS — R6889 Other general symptoms and signs: Secondary | ICD-10-CM

## 2015-04-20 DIAGNOSIS — H832X9 Labyrinthine dysfunction, unspecified ear: Secondary | ICD-10-CM
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
## 2015-04-22 ENCOUNTER — Ambulatory Visit: Payer: Medicare Other | Admitting: Physical Therapy

## 2015-04-22 ENCOUNTER — Encounter: Payer: Self-pay | Admitting: Physical Therapy

## 2015-04-22 DIAGNOSIS — R269 Unspecified abnormalities of gait and mobility: Secondary | ICD-10-CM | POA: Diagnosis not present

## 2015-04-22 DIAGNOSIS — R29898 Other symptoms and signs involving the musculoskeletal system: Secondary | ICD-10-CM

## 2015-04-22 DIAGNOSIS — R2689 Other abnormalities of gait and mobility: Secondary | ICD-10-CM

## 2015-04-22 DIAGNOSIS — R6889 Other general symptoms and signs: Secondary | ICD-10-CM

## 2015-04-22 DIAGNOSIS — H832X9 Labyrinthine dysfunction, unspecified ear: Secondary | ICD-10-CM
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
## 2015-04-28 ENCOUNTER — Encounter: Payer: Self-pay | Admitting: Physical Therapy

## 2015-04-28 ENCOUNTER — Ambulatory Visit: Payer: Medicare Other | Admitting: Physical Therapy

## 2015-04-28 DIAGNOSIS — H832X9 Labyrinthine dysfunction, unspecified ear: Secondary | ICD-10-CM

## 2015-04-28 DIAGNOSIS — R29898 Other symptoms and signs involving the musculoskeletal system: Secondary | ICD-10-CM

## 2015-04-28 DIAGNOSIS — R6889 Other general symptoms and signs: Secondary | ICD-10-CM

## 2015-04-28 DIAGNOSIS — R2689 Other abnormalities of gait and mobility: Secondary | ICD-10-CM

## 2015-04-28 DIAGNOSIS — R269 Unspecified abnormalities of gait and mobility: Secondary | ICD-10-CM | POA: Diagnosis not present
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
## 2015-04-29 ENCOUNTER — Encounter: Payer: Self-pay | Admitting: Physical Therapy

## 2015-04-29 ENCOUNTER — Ambulatory Visit: Payer: Medicare Other | Attending: Neurology | Admitting: Physical Therapy

## 2015-04-29 DIAGNOSIS — R6889 Other general symptoms and signs: Secondary | ICD-10-CM

## 2015-04-29 DIAGNOSIS — R269 Unspecified abnormalities of gait and mobility: Secondary | ICD-10-CM | POA: Diagnosis present

## 2015-04-29 DIAGNOSIS — R29818 Other symptoms and signs involving the nervous system: Secondary | ICD-10-CM | POA: Insufficient documentation

## 2015-04-29 DIAGNOSIS — R201 Hypoesthesia of skin: Secondary | ICD-10-CM | POA: Diagnosis present

## 2015-04-29 DIAGNOSIS — M259 Joint disorder, unspecified: Secondary | ICD-10-CM | POA: Insufficient documentation

## 2015-04-29 DIAGNOSIS — R2681 Unsteadiness on feet: Secondary | ICD-10-CM | POA: Insufficient documentation

## 2015-04-29 DIAGNOSIS — H832X9 Labyrinthine dysfunction, unspecified ear: Secondary | ICD-10-CM | POA: Insufficient documentation

## 2015-04-29 DIAGNOSIS — R29898 Other symptoms and signs involving the musculoskeletal system: Secondary | ICD-10-CM

## 2015-04-29 DIAGNOSIS — R2689 Other abnormalities of gait and mobility: Secondary | ICD-10-CM
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
## 2015-05-04 ENCOUNTER — Ambulatory Visit: Payer: Medicare Other | Admitting: Physical Therapy

## 2015-05-04 DIAGNOSIS — R6889 Other general symptoms and signs: Secondary | ICD-10-CM | POA: Diagnosis not present

## 2015-05-04 DIAGNOSIS — R201 Hypoesthesia of skin: Secondary | ICD-10-CM

## 2015-05-04 DIAGNOSIS — R269 Unspecified abnormalities of gait and mobility: Secondary | ICD-10-CM

## 2015-05-04 DIAGNOSIS — H832X9 Labyrinthine dysfunction, unspecified ear: Secondary | ICD-10-CM
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
## 2015-05-06 ENCOUNTER — Ambulatory Visit: Payer: Medicare Other | Admitting: Physical Therapy

## 2015-05-10 ENCOUNTER — Ambulatory Visit: Payer: Medicare Other | Admitting: Physical Therapy

## 2015-05-10 ENCOUNTER — Encounter: Payer: Self-pay | Admitting: Physical Therapy

## 2015-05-10 DIAGNOSIS — R6889 Other general symptoms and signs: Secondary | ICD-10-CM | POA: Diagnosis not present

## 2015-05-10 DIAGNOSIS — R201 Hypoesthesia of skin: Secondary | ICD-10-CM

## 2015-05-10 DIAGNOSIS — R269 Unspecified abnormalities of gait and mobility: Secondary | ICD-10-CM

## 2015-05-10 DIAGNOSIS — H832X9 Labyrinthine dysfunction, unspecified ear: Secondary | ICD-10-CM

## 2015-05-10 DIAGNOSIS — R2689 Other abnormalities of gait and mobility: Secondary | ICD-10-CM

## 2015-05-10 NOTE — Patient Instructions (Signed)
   Toe Taps  Start with right foot on the step and then place left foot on the step. Alternate feet. Count one leg and perform 20 times.  Maintain core stability throughout movement. Reps                                              20                                                                                                      Complete    2                 Set(s)    Perform                2      Time(s)     A day
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
## 2015-05-12 ENCOUNTER — Encounter: Payer: Self-pay | Admitting: Physical Therapy

## 2015-05-12 ENCOUNTER — Ambulatory Visit: Payer: Medicare Other | Admitting: Physical Therapy

## 2015-05-12 DIAGNOSIS — R6889 Other general symptoms and signs: Secondary | ICD-10-CM

## 2015-05-12 DIAGNOSIS — R201 Hypoesthesia of skin: Secondary | ICD-10-CM

## 2015-05-12 DIAGNOSIS — R269 Unspecified abnormalities of gait and mobility: Secondary | ICD-10-CM

## 2015-05-12 DIAGNOSIS — R2689 Other abnormalities of gait and mobility: Secondary | ICD-10-CM

## 2015-05-12 DIAGNOSIS — H832X9 Labyrinthine dysfunction, unspecified ear: Secondary | ICD-10-CM
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
## 2015-05-16 ENCOUNTER — Ambulatory Visit: Payer: Medicare Other | Admitting: Physical Therapy

## 2015-05-16 DIAGNOSIS — R269 Unspecified abnormalities of gait and mobility: Secondary | ICD-10-CM

## 2015-05-16 DIAGNOSIS — R6889 Other general symptoms and signs: Secondary | ICD-10-CM | POA: Diagnosis not present

## 2015-05-16 DIAGNOSIS — R2681 Unsteadiness on feet: Secondary | ICD-10-CM

## 2015-05-16 NOTE — Patient Instructions (Addendum)
Tandem Walking   Stand with one hand on your kitchen countertop. Walk with each foot directly in front of other, heel of one foot touching toes of other foot with each step. Both feet straight ahead.  Do this with one hand support (if needed) on your countertop. Walk the length of your kitchen countertop, turn around, and repeat.  Walk the length of your countertop 6 times total. Do 3 sets per day.    Walking Head Turn   Standing with one hand on the kitchen countertop, walk while turning head from left to right. Touch countertop if necessary to keep balance. Walk the length of your kitchen countertop, turn around, and repeat.  Walk the length of your countertop 6 times total. Do 3 sets per day. Then, do the same exercise while looking upward, then downward.
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
## 2015-05-18 ENCOUNTER — Ambulatory Visit: Payer: Medicare Other | Admitting: Physical Therapy

## 2015-05-18 DIAGNOSIS — R269 Unspecified abnormalities of gait and mobility: Secondary | ICD-10-CM

## 2015-05-18 DIAGNOSIS — R6889 Other general symptoms and signs: Secondary | ICD-10-CM | POA: Diagnosis not present

## 2015-05-18 DIAGNOSIS — R2681 Unsteadiness on feet: Secondary | ICD-10-CM
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
## 2015-05-18 NOTE — Patient Instructions (Addendum)
   Tandem Walking   Stand with one hand on your kitchen countertop. Walk with each foot directly in front of other, heel of one foot touching toes of other foot with each step. Both feet straight ahead.  Do this with one hand support (if needed) on your countertop. Walk the length of your kitchen countertop, turn around, and repeat.  Walk the length of your countertop 6 times total. Do 3 sets per day.    Walking Head Turn   Standing with one hand on the kitchen countertop, walk while turning head from left to right. Touch countertop if necessary to keep balance. Walk the length of your kitchen countertop, turn around, and repeat.  Walk the length of your countertop 6 times total. Do 3 sets per day. Then, do the same exercise while looking upward, then downward.   Feet Apart, Head Motion - Eyes Open   With eyes open, feet apart, move head slowly: right /left, up/ down, up-right / down-left, and up-left / down-right. Repeat __10-15__ times each direction per session. Do __1-3__ sessions per day.  Copyright  VHI. All rights reserved.  Feet Apart, Head Motion - Eyes Closed   With eyes closed and feet shoulder width apart, move head slowly, right /left, up/ down, up-right / down-left, and up-left / down-right. Repeat __10-15__ times per session. Do __1-3__ sessions per day.  Copyright  VHI. All rights reserved.  Feet Apart (Compliant Surface) Head Motion - Eyes Open   With eyes open, standing on compliant surface: ___pillow or foam_____, feet shoulder width apart, move head slowly: right /left, up/ down, up-right / down-left, and up-left / down-right. Repeat __10-15__ times per session. Do __1-3__ sessions per day.   Quads / HF, Supine   Lie near edge of bed, one leg bent, foot flat on bed. Other leg hanging over edge with your thigh off the bed slightly. Bend hanging knee backward keeping thigh in contact with bed. Hold _60__ seconds. Do this twice per day on each  leg.  High Step - Reminder   Stand at the foot of your steps. Tap the toes of your right foot on the bottom stp, then tap your left toes. Alternate tapping toes on step. Try to do this without using your hands on the rail (but use the rail, if needed, for balance). Do 15 consecutive taps. Perform 3 sets. Try not to look at your feet, if possible.

## 2015-05-23 ENCOUNTER — Ambulatory Visit: Payer: Medicare Other | Admitting: Physical Therapy

## 2015-05-23 ENCOUNTER — Encounter: Payer: Self-pay | Admitting: Physical Therapy

## 2015-05-23 DIAGNOSIS — R6889 Other general symptoms and signs: Secondary | ICD-10-CM

## 2015-05-23 DIAGNOSIS — R2681 Unsteadiness on feet: Secondary | ICD-10-CM

## 2015-05-23 DIAGNOSIS — R201 Hypoesthesia of skin: Secondary | ICD-10-CM

## 2015-05-23 DIAGNOSIS — H832X9 Labyrinthine dysfunction, unspecified ear: Secondary | ICD-10-CM

## 2015-05-23 DIAGNOSIS — R269 Unspecified abnormalities of gait and mobility: Secondary | ICD-10-CM

## 2015-05-23 DIAGNOSIS — R29898 Other symptoms and signs involving the musculoskeletal system: Secondary | ICD-10-CM
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
## 2015-05-23 NOTE — Patient Instructions (Signed)
Fitness Plan has 4 components.  1. Endurance - Recommend machines that are sitting with back support that uses both your arms and your legs.Or can do walking program Goal is 20-30 minutes. 2. Strength - weight machines enough weight to have resistance but not so much that you have to strain or cheat.  Goal is to do 15 repetitions for 2-3 sets. Rest 30-60 seconds between sets.  Do 2-4 leg machines, 2-4 arm machines & 2-4 trunk machines. Look for pictures to make sure you exercise both sides of arm, leg or trunk.  Leg machines like leg press (can do both legs or each leg by themselves),   At home can do theraband exercises for arms or legs, floor transfers, sit to stand to sit using arms as little as possible, Yoga positions 3.Flexibility - make sure you include arms, legs & trunk. Can do Yoga also 4. Balance- can work in corner with chair in front - head turns, arm motions, eyes closed; place one foot inside cabinet or on 4-6" block to work on one-legged stance,   Try to get to each component 3-5 times per week.  Going to [employer] or fitness center you want do things you can not do at home.  For example use bikes at [employer] if you don't have one at home. Then on days you don't go to [employer], do exercises at home. 

## 2015-05-25 ENCOUNTER — Ambulatory Visit: Payer: Medicare Other | Admitting: Physical Therapy

## 2015-05-25 ENCOUNTER — Encounter: Payer: Self-pay | Admitting: Physical Therapy

## 2015-05-25 DIAGNOSIS — R6889 Other general symptoms and signs: Secondary | ICD-10-CM | POA: Diagnosis not present

## 2015-05-25 DIAGNOSIS — R269 Unspecified abnormalities of gait and mobility: Secondary | ICD-10-CM

## 2015-05-25 DIAGNOSIS — R2681 Unsteadiness on feet: Secondary | ICD-10-CM
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
## 2015-05-25 NOTE — Patient Instructions (Signed)
Feet Apart, Head Motion - Eyes Open   With eyes open, feet apart, move head slowly: right /left, up/ down, up-right / down-left, and up-left / down-right. Repeat __10-15__ times each direction per session. Do __1-3__ sessions per day.  Copyright  VHI. All rights reserved.  Feet Apart, Head Motion - Eyes Closed   With eyes closed and feet shoulder width apart, move head slowly, right /left, up/ down, up-right / down-left, and up-left / down-right. Repeat __10-15__ times per session. Do __1-3__ sessions per day.  Copyright  VHI. All rights reserved.  Feet Apart (Compliant Surface) Head Motion - Eyes Open   With eyes open, standing on compliant surface: ___pillow or foam_____, feet shoulder width apart, move head slowly: right /left, up/ down, up-right / down-left, and up-left / down-right. Repeat __10-15__ times per session. Do __1-3__ sessions per day.    Toe Taps  Start with right foot on the step and then place left foot on the step. Alternate feet. Count one leg and perform 20 times.  Maintain core stability throughout movement. Reps   20                       Complete  2   Set(s)    Perform   2  Time(s)   A day      Tandem Walking   Stand with one hand on your kitchen countertop. Walk with each foot directly in front of other, heel of one foot touching toes of other foot with each step. Both feet straight ahead.  Do this with one hand support (if needed) on your countertop. Walk the length of your kitchen countertop, turn around, and repeat.  Walk the length of your countertop 6 times total. Do 3 sets per day.    Walking Head Turn   Standing with one hand on the kitchen countertop, walk while turning head from left to right. Touch countertop if necessary to keep balance. Walk the length of  your kitchen countertop, turn around, and repeat.  Quads / HF, Supine   Lie near edge of bed, one leg bent, foot flat on bed. Other leg hanging over edge with your thigh off the bed slightly. Bend hanging knee backward keeping thigh in contact with bed. Hold _60__ seconds. Do this twice per day on each leg.  High Step - Reminder   Stand at the foot of your steps. Tap the toes of your right foot on the bottom stp, then tap your left toes. Alternate tapping toes on step. Try to do this without using your hands on the rail (but use the rail, if needed, for balance). Do 15 consecutive taps. Perform 3 sets. Try not to look at your feet, if possible.

## 2015-05-30 ENCOUNTER — Ambulatory Visit: Payer: Medicare Other | Attending: Neurology | Admitting: Physical Therapy

## 2015-05-30 ENCOUNTER — Encounter: Payer: Self-pay | Admitting: Physical Therapy

## 2015-05-30 DIAGNOSIS — R29818 Other symptoms and signs involving the nervous system: Secondary | ICD-10-CM | POA: Insufficient documentation

## 2015-05-30 DIAGNOSIS — R6889 Other general symptoms and signs: Secondary | ICD-10-CM

## 2015-05-30 DIAGNOSIS — H832X9 Labyrinthine dysfunction, unspecified ear: Secondary | ICD-10-CM | POA: Diagnosis present

## 2015-05-30 DIAGNOSIS — R2681 Unsteadiness on feet: Secondary | ICD-10-CM | POA: Diagnosis present

## 2015-05-30 DIAGNOSIS — R201 Hypoesthesia of skin: Secondary | ICD-10-CM | POA: Diagnosis present

## 2015-05-30 DIAGNOSIS — R269 Unspecified abnormalities of gait and mobility: Secondary | ICD-10-CM | POA: Insufficient documentation

## 2015-05-30 NOTE — Patient Instructions (Addendum)
Chair Sitting   Sit at edge of seat, spine straight, one leg extended. Put a hand on each thigh and bend forward from the hip, keeping spine straight. Allow hand on straight leg to reach down until stretch is felt, keep knee straight. Support upper body with other arm. Hold _20__ seconds. Repeat _3__ times each leg. Do _1-2__ sessions per day.  Copyright  VHI. All rights reserved.  Calf Stretch   Place one leg forward, bent, other leg behind and straight. Lean forward keeping back heel flat. Hold _20_ seconds. Repeat with other leg forward. Repeat __3__ times each leg forward. Do _1-2___ sessions per day.  http://gt2.exer.us/477   Copyright  VHI. All rights reserved.  Ankle Plantar Flexion / Dorsiflexion, Standing   Stand while holding a stable object. Rise up on toes. Hold for 5 seconds. Then rock back on heels. Hold 5 seconds. Repeat _10__ times. Do _1-2__ sessions per day.  Copyright  VHI. All rights reserved.  Lumbar Rotation (Sitting)   Arms crossed, gently rotate trunk from side to side in a small, pain-free range of motion. TRY TO LOOK OVER YOUR SHOULDER BEHIND YOU. Repeat __10__ times each way. Do _1-2__ sessions per day.   http://orth.exer.us/164   Copyright  VHI. All rights reserved.
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
## 2015-06-01 ENCOUNTER — Ambulatory Visit: Payer: Medicare Other | Admitting: Physical Therapy

## 2015-06-01 ENCOUNTER — Encounter: Payer: Self-pay | Admitting: Physical Therapy

## 2015-06-01 DIAGNOSIS — R269 Unspecified abnormalities of gait and mobility: Secondary | ICD-10-CM

## 2015-06-01 DIAGNOSIS — R6889 Other general symptoms and signs: Secondary | ICD-10-CM

## 2015-06-01 DIAGNOSIS — R2681 Unsteadiness on feet: Secondary | ICD-10-CM

## 2015-06-01 DIAGNOSIS — H832X9 Labyrinthine dysfunction, unspecified ear: Secondary | ICD-10-CM
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
## 2015-06-06 ENCOUNTER — Ambulatory Visit: Payer: Medicare Other | Admitting: Physical Therapy

## 2015-06-06 DIAGNOSIS — R269 Unspecified abnormalities of gait and mobility: Secondary | ICD-10-CM | POA: Diagnosis not present

## 2015-06-06 DIAGNOSIS — R2681 Unsteadiness on feet: Secondary | ICD-10-CM

## 2015-06-06 DIAGNOSIS — R201 Hypoesthesia of skin: Secondary | ICD-10-CM
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
## 2015-06-08 ENCOUNTER — Encounter: Payer: Self-pay | Admitting: Physical Therapy

## 2015-06-08 ENCOUNTER — Ambulatory Visit: Payer: Medicare Other | Admitting: Physical Therapy

## 2015-06-08 DIAGNOSIS — R6889 Other general symptoms and signs: Secondary | ICD-10-CM

## 2015-06-08 DIAGNOSIS — R269 Unspecified abnormalities of gait and mobility: Secondary | ICD-10-CM | POA: Diagnosis not present

## 2015-06-08 DIAGNOSIS — R2681 Unsteadiness on feet: Secondary | ICD-10-CM

## 2015-06-08 DIAGNOSIS — R2689 Other abnormalities of gait and mobility: Secondary | ICD-10-CM
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
## 2015-06-09 ENCOUNTER — Encounter: Payer: Self-pay | Admitting: Physical Therapy

## 2015-06-09 NOTE — Therapy (Signed)
[facility] [address] [place], [place], [address] Phone: [phone]   Fax:  [phone]  Patient Details  Name: Richard Jacobs MRN: [id number] Date of Birth: September 07, 1928 Referring Provider:  No ref. provider found  Encounter Date: 06/09/2015  PHYSICAL THERAPY DISCHARGE SUMMARY  Visits from Start of Care: 17  Current functional level related to goals / functional outcomes:     PT Long Term Goals - 06/08/15 1536    PT LONG TERM GOAL #1   Title patient verbalizes & demonstrates understanding of fitness plan / HEP. (Target Date: 06/10/2015)   Baseline Achieved 7/20.   Status Achieved   PT LONG TERM GOAL #2   Title Berg Balance >45/56 (Target Date: 06/10/2015)   Baseline 06/06/15: Berg score = 49/56   Status Achieved   PT LONG TERM GOAL #3   Title Gait Velocity >2.62 ft/sec (Target Date: 06/10/2015)   Baseline 06/06/15: self-selected gait speed = 2.65 ft/sec with SPC   Status Achieved   PT LONG TERM GOAL #4   Title Functional Gait Assessment >/= 22/30 (Target Date: 06/10/2015)   Baseline 06/08/15: 24/30 scored today   Time --   Period --   Status Achieved   PT LONG TERM GOAL #5   Title patient ambulates 500' on various surfaces including ramps & curbs, stairs (1 rail) independently.   Baseline Met on 06/06/15.   Time 8   Period Weeks   Status Achieved       Remaining deficits: Balance deficits noted by Milford Cage 49/56 & Functional Gait Assessment 24/30   Education / Equipment: HEP / fitness plan.  Plan: Patient agrees to discharge.  Patient goals were met. Patient is being discharged due to meeting the stated rehab goals.  ?????        Yazmyn Valbuena PT, DPT 06/09/2015, 5:16 PM  [facility] [address] [place], [place], [address] Phone: [phone]   Fax:  [phone]

## 2015-06-13 ENCOUNTER — Encounter: Payer: Self-pay | Admitting: Neurology

## 2015-06-13 ENCOUNTER — Ambulatory Visit (INDEPENDENT_AMBULATORY_CARE_PROVIDER_SITE_OTHER): Payer: Medicare Other | Admitting: Neurology

## 2015-06-13 VITALS — BP 140/78 | HR 68 | Ht 70.0 in | Wt 210.2 lb

## 2015-06-13 DIAGNOSIS — R269 Unspecified abnormalities of gait and mobility: Secondary | ICD-10-CM | POA: Diagnosis not present

## 2015-06-13 NOTE — Patient Instructions (Signed)
I had a long discussion with the patient and his wife regarding his gait and balance difficulties due to peripheral neuropathy of undetermined cause. I encouraged him to use a cane and walker at all times and follow fall and safety precautions. He was also advised to get up slowly and avoid certain movements and turns. Unfortunately there is no specific treatment for his neuropathy which perhaps is related to either chemotherapy effect or his prostate cancer. He has an upcoming appointment with his cancer doctor to discuss treatment options. Greater than 50% of time of this 25 minute visit was spent on counseling and coordination of care. He was advised to return for follow-up in 6 months or call earlier if necessary. Fall Prevention and Home Safety Falls cause injuries and can affect all age groups. It is possible to use preventive measures to significantly decrease the likelihood of falls. There are many simple measures which can make your home safer and prevent falls. OUTDOORS  Repair cracks and edges of walkways and driveways.  Remove high doorway thresholds.  Trim shrubbery on the main path into your home.  Have good outside lighting.  Clear walkways of tools, rocks, debris, and clutter.  Check that handrails are not broken and are securely fastened. Both sides of steps should have handrails.  Have leaves, snow, and ice cleared regularly.  Use sand or salt on walkways during winter months.  In the garage, clean up grease or oil spills. BATHROOM  Install night lights.  Install grab bars by the toilet and in the tub and shower.  Use non-skid mats or decals in the tub or shower.  Place a plastic non-slip stool in the shower to sit on, if needed.  Keep floors dry and clean up all water on the floor immediately.  Remove soap buildup in the tub or shower on a regular basis.  Secure bath mats with non-slip, double-sided rug tape.  Remove throw rugs and tripping hazards from the  floors. BEDROOMS  Install night lights.  Make sure a bedside light is easy to reach.  Do not use oversized bedding.  Keep a telephone by your bedside.  Have a firm chair with side arms to use for getting dressed.  Remove throw rugs and tripping hazards from the floor. KITCHEN  Keep handles on pots and pans turned toward the center of the stove. Use back burners when possible.  Clean up spills quickly and allow time for drying.  Avoid walking on wet floors.  Avoid hot utensils and knives.  Position shelves so they are not too high or low.  Place commonly used objects within easy reach.  If necessary, use a sturdy step stool with a grab bar when reaching.  Keep electrical cables out of the way.  Do not use floor polish or wax that makes floors slippery. If you must use wax, use non-skid floor wax.  Remove throw rugs and tripping hazards from the floor. STAIRWAYS  Never leave objects on stairs.  Place handrails on both sides of stairways and use them. Fix any loose handrails. Make sure handrails on both sides of the stairways are as long as the stairs.  Check carpeting to make sure it is firmly attached along stairs. Make repairs to worn or loose carpet promptly.  Avoid placing throw rugs at the top or bottom of stairways, or properly secure the rug with carpet tape to prevent slippage. Get rid of throw rugs, if possible.  Have an electrician put in a light switch  at the top and bottom of the stairs. OTHER FALL PREVENTION TIPS  Wear low-heel or rubber-soled shoes that are supportive and fit well. Wear closed toe shoes.  When using a stepladder, make sure it is fully opened and both spreaders are firmly locked. Do not climb a closed stepladder.  Add color or contrast paint or tape to grab bars and handrails in your home. Place contrasting color strips on first and last steps.  Learn and use mobility aids as needed. Install an electrical emergency response  system.  Turn on lights to avoid dark areas. Replace light bulbs that burn out immediately. Get light switches that glow.  Arrange furniture to create clear pathways. Keep furniture in the same place.  Firmly attach carpet with non-skid or double-sided tape.  Eliminate uneven floor surfaces.  Select a carpet pattern that does not visually hide the edge of steps.  Be aware of all pets. OTHER HOME SAFETY TIPS  Set the water temperature for 120 F (48.8 C).  Keep emergency numbers on or near the telephone.  Keep smoke detectors on every level of the home and near sleeping areas. Document Released: 10/05/2002 Document Revised: 04/15/2012 Document Reviewed: 01/04/2012 [facility] Patient Information 2015 [place], [place]. This information is not intended to replace advice given to you by your health care provider. Make sure you discuss any questions you have with your health care provider.

## 2015-06-13 NOTE — Progress Notes (Signed)
PATIENT: Richard Jacobs DOB: 07-30-1928  REASON FOR VISIT: routine stroke follow up HISTORY FROM: patient  HISTORY OF PRESENT ILLNESS: 81 year Caucasian male who developed left lower lip numbness and gait imbalance night of 10/01/13 but did not seek help until next day when he developed slurred speech and left facial droop.  I have reviewed his hospital admission records. CT head showed no acute abnormalities and MRI brain personally reviewed by me showed patchy right parietal subcortical and cortical small infarct. 2DEcho and carotid dopplers were unremarkable and MRA brain showed no large vessel stenosis. He was seen by neurohospitalist and started on aspirin and plavix and has done well and symptoms mostly resolved at discharge. He has finished outpatient therapies and has been tolerating lipitor without myalgia's and dual antiplatelet therapy without bleeding or bruising. He does notice some drooling from left corner of mouth at night upon awakening in am.  He has been doing exercises taught to him by speech therapy regularly.  He states his blood pressure is well controlled and it is 110/63 in our office.  He has h/o melanomas and is scheduled to have one removed from his left foot soon and is concerned about stopping antiplatelet and stroke risk.  He has no prior h/o stroke, TIA, seizures or significant neurological problems.   UPDATE 03/25/14 (LL): Since last visit, patient has been doing well.  He is staying active.  He does not feel as strong in his legs that he did before stroke, but he is able to do everything he wants to do.  He is tolerating Plavix well with no signs of significant bleeding or bruising.  His blood pressure is well controlled, it is 120/65 in our office.  He is undergoing treatment for prostate cancer, with Zytiga.  Treatments are going well so far. He has no complaints today. Update 11/30/2014 : He returns for follow-up after last visit 7 months ago. He is accompanied by  his wife who stated that his notice difficulty walking with weakness in his legs beneath the knees as well as numbness ever since he was started on chemotherapy Zytiga for his prostate cancer and prednisone 6 months ago. He has not had any major falls but has to be careful when he gets up and walks particularly in the dark. He denies any back pain or radicular pain. He had discontinued Lipitor after the last visit and his leg aches and pains improved. He has noticed that the speed of walking is quite diminished and he has at times dragging his feet are long and he is constantly scared that he will fall. He has an appointment to see his oncologist at [facility] next week for prostate cancer. He has not had any recurrent stroke or TIA symptoms since December 2014 and remains on Plavix which is tolerating well with only minor bruising.  Update 03/10/2015 : He returns for follow-up after last visit to 3 months ago. He continues to have gait and balance difficulties but no paresthesias and burning in his feet. He reports decreased strength from knee down. He has had no falls as he is quite careful. He had EMG/nerve conduction study done on 2/8 at 16 by Dr. 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 He denies any pain in his right hand, tingling numbness or difficulty using it. Lab work done on 11/30/14 shows normal ESR, TSH, angiotensin-converting enzyme levels. Vitamin D level is low at 21.1  and ANA is positive. Patient denies any history suggestive of rheumatological disorder in the form of arthralgias, myalgias or rash. Serum protein electrophoresis is also normal. The patient was advised to see Dr. Reynaldo Minium to get treatment for low vitamin D but he has not done that yet. Update 06/13/2015 :  He returns for follow-up after last visit 3 months ago. He has seen Dr. Reynaldo Minium and been started on vitamin D oral pills which has been taking regularly. He did go  to physical therapy for 8 weeks and has learned balance exercises which he is doing intermittently at home still. He uses a cane mostly and occasionally a walker. He is careful and has not had any recent falls or injuries. He remains on Zytiga for his prostate cancer and prednisone and has an upcoming visit with his cancer doctor in a few weeks and will have PSA level checked. He denies tingling numbness pain or burning in his feet but feels he is weak from the knee down in both legs. He is on Plavix and getting minor bruising. ROS:  14 system review of systems is positive for leg weakness, gait difficulty, balance difficulties, easy bruisability,moles and all other systems negative  ALLERGIES: No Known Allergies  HOME MEDICATIONS: Outpatient Encounter Prescriptions as of 06/13/2015  Medication Sig Note  . abiraterone Acetate (ZYTIGA) 250 MG tablet 4 tabs po daily - take 1 hour before or 2 hours a meal 06/13/2015: Received from: [facility]  . cholecalciferol (VITAMIN D) 1000 UNITS tablet Take 1,000 Units by mouth daily.   . clopidogrel (PLAVIX) 75 MG tablet TAKE 1 TABLET (75 MG TOTAL) BY MOUTH DAILY.   Marland Kitchen levothyroxine (SYNTHROID, LEVOTHROID) 100 MCG tablet Take 100 mcg by mouth daily. 11/30/2014: Received from: External Pharmacy Received Sig:   . predniSONE (DELTASONE) 5 MG tablet Take 5 mg by mouth 2 (two) times daily with a meal. 03/10/2015: 03/10/15 takes 5 mg every am, 2.5 mg every hs  . [DISCONTINUED] predniSONE (DELTASONE) 5 MG tablet 1 po bid 06/13/2015: Received from: [facility]  . [DISCONTINUED] ZYTIGA 250 MG tablet Take 1,000 mg by mouth every evening.  03/25/2014: Received from: External Pharmacy   No facility-administered encounter medications on file as of 06/13/2015.     PHYSICAL EXAM  Filed Vitals:   06/13/15 0829  BP: 140/78  Pulse: 68  Height: 5' 10"  (1.778 m)  Weight: 210 lb 3.2 oz (95.346 kg)   Body mass index is 30.16  kg/(m^2). No exam data present   Physical Exam  General: well developed, well nourished Caucasian male, seated, in no evident distress  Head: head normocephalic and atraumatic.    Neck: supple with no carotid or supraclavicular bruits  Cardiovascular: regular rate and rhythm,soft ejection murmur 2/6   Musculoskeletal: no deformity  Skin: few /petichiae on forearms Vascular: Normal pulses all extremities   Neurologic Exam  Mental Status: Awake and fully alert. Oriented to place and time. Recent and remote memory intact. Attention span, concentration and fund of knowledge appropriate. Mood and affect appropriate.  Cranial Nerves: Pupils equal, briskly reactive to light. Extraocular movements full without nystagmus. Visual fields full to confrontation. Hearing intact. Facial sensation intact. Mild lower left Face weak., tongue, palate moves normally and symmetrically.  Motor: Normal bulk and tone. Normal strength in all tested extremity muscles. Diminished fine finger movements on left. Orbits right over left upper extremity.  Sensory:diminished light touch and pinprick sensation in both legs from ankle down. Impaired  vibration and position sense from ankle down bilaterally. Romberg's is weakly positive. Coordination: Rapid alternating movements normal in all extremities. Finger-to-nose and heel-to-shin performed accurately bilaterally.  Gait and Station: Arises from chair without difficulty. Stance is normal. Gait demonstrates normal stride length  And wide base . Unable to heel, toe and tandem walk without difficulty.  Reflexes: 1+ and symmetric except right ankle jerk is depressed.. Toes downgoing.   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 Axonal peripheral neuropathy of unknown etiology with negative evaluation for treatable causes except low vitamin D levels   PLAN:     I had a long discussion with the patient and his wife regarding his gait and balance difficulties due to peripheral neuropathy of undetermined cause. I encouraged him to use a cane and walker at all times and follow fall and safety precautions. He was also advised to get up slowly and avoid certain movements and turns. Unfortunately there is no specific treatment for his neuropathy which perhaps is related to either chemotherapy effect or his prostate cancer. He has an upcoming appointment with his cancer doctor to discuss treatment options. Continue Plavix for secondary stroke prevention. Greater than 50% of time of this 25 minute visit was spent on counseling and coordination of care. He was advised to return for follow-up in 6 months or call earlier if necessary. Antony Contras, MD  06/13/2015, 9:15 AM [facility] [address] [place], [place] [address] [phone]  Note: This document was prepared with digital dictation and possible smart phrase technology. Any transcriptional errors that result from this process are unintentional.

## 2015-10-09 ENCOUNTER — Other Ambulatory Visit: Payer: Self-pay | Admitting: Neurology

## 2015-10-10 ENCOUNTER — Other Ambulatory Visit: Payer: Self-pay

## 2015-10-10 MED ORDER — CLOPIDOGREL BISULFATE 75 MG PO TABS: ORAL_TABLET | ORAL | Status: DC

## 2015-10-11 ENCOUNTER — Other Ambulatory Visit: Payer: Self-pay | Admitting: Neurology

## 2015-12-14 ENCOUNTER — Ambulatory Visit: Payer: Medicare Other | Admitting: Neurology

## 2016-04-06 ENCOUNTER — Other Ambulatory Visit: Payer: Self-pay | Admitting: Neurology

## 2016-04-28 DIAGNOSIS — A419 Sepsis, unspecified organism: Secondary | ICD-10-CM

## 2016-04-28 DIAGNOSIS — J189 Pneumonia, unspecified organism: Secondary | ICD-10-CM

## 2016-04-28 HISTORY — DX: Pneumonia, unspecified organism: J18.9

## 2016-04-28 HISTORY — DX: Sepsis, unspecified organism: A41.9

## 2016-05-10 ENCOUNTER — Other Ambulatory Visit: Payer: Self-pay | Admitting: Neurology

## 2016-05-21 ENCOUNTER — Encounter (HOSPITAL_COMMUNITY): Payer: Self-pay

## 2016-05-21 ENCOUNTER — Inpatient Hospital Stay (HOSPITAL_COMMUNITY)
Admission: EM | Admit: 2016-05-21 | Discharge: 2016-05-24 | DRG: 871 | Disposition: A | Discharge status: Home Health | Payer: Medicare Other | Attending: Internal Medicine | Admitting: Internal Medicine

## 2016-05-21 ENCOUNTER — Emergency Department (HOSPITAL_COMMUNITY): Payer: Medicare Other

## 2016-05-21 DIAGNOSIS — Z8546 Personal history of malignant neoplasm of prostate: Secondary | ICD-10-CM

## 2016-05-21 DIAGNOSIS — Z720 Tobacco use: Secondary | ICD-10-CM

## 2016-05-21 DIAGNOSIS — I350 Nonrheumatic aortic (valve) stenosis: Secondary | ICD-10-CM | POA: Diagnosis present

## 2016-05-21 DIAGNOSIS — R627 Adult failure to thrive: Secondary | ICD-10-CM | POA: Diagnosis present

## 2016-05-21 DIAGNOSIS — A419 Sepsis, unspecified organism: Principal | ICD-10-CM

## 2016-05-21 DIAGNOSIS — G9340 Encephalopathy, unspecified: Secondary | ICD-10-CM | POA: Insufficient documentation

## 2016-05-21 DIAGNOSIS — N179 Acute kidney failure, unspecified: Secondary | ICD-10-CM | POA: Diagnosis not present

## 2016-05-21 DIAGNOSIS — N183 Chronic kidney disease, stage 3 (moderate): Secondary | ICD-10-CM | POA: Diagnosis present

## 2016-05-21 DIAGNOSIS — W1830XA Fall on same level, unspecified, initial encounter: Secondary | ICD-10-CM | POA: Diagnosis present

## 2016-05-21 DIAGNOSIS — E039 Hypothyroidism, unspecified: Secondary | ICD-10-CM | POA: Diagnosis not present

## 2016-05-21 DIAGNOSIS — Z800 Family history of malignant neoplasm of digestive organs: Secondary | ICD-10-CM

## 2016-05-21 DIAGNOSIS — E876 Hypokalemia: Secondary | ICD-10-CM | POA: Diagnosis present

## 2016-05-21 DIAGNOSIS — M6282 Rhabdomyolysis: Secondary | ICD-10-CM | POA: Diagnosis not present

## 2016-05-21 DIAGNOSIS — J189 Pneumonia, unspecified organism: Secondary | ICD-10-CM

## 2016-05-21 DIAGNOSIS — C61 Malignant neoplasm of prostate: Secondary | ICD-10-CM | POA: Diagnosis present

## 2016-05-21 DIAGNOSIS — I69354 Hemiplegia and hemiparesis following cerebral infarction affecting left non-dominant side: Secondary | ICD-10-CM

## 2016-05-21 DIAGNOSIS — W19XXXA Unspecified fall, initial encounter: Secondary | ICD-10-CM | POA: Diagnosis present

## 2016-05-21 DIAGNOSIS — C7951 Secondary malignant neoplasm of bone: Secondary | ICD-10-CM | POA: Diagnosis present

## 2016-05-21 DIAGNOSIS — Z79899 Other long term (current) drug therapy: Secondary | ICD-10-CM

## 2016-05-21 DIAGNOSIS — Z7902 Long term (current) use of antithrombotics/antiplatelets: Secondary | ICD-10-CM

## 2016-05-21 DIAGNOSIS — G9341 Metabolic encephalopathy: Secondary | ICD-10-CM | POA: Diagnosis present

## 2016-05-21 DIAGNOSIS — I639 Cerebral infarction, unspecified: Secondary | ICD-10-CM | POA: Diagnosis present

## 2016-05-21 DIAGNOSIS — R296 Repeated falls: Secondary | ICD-10-CM | POA: Diagnosis present

## 2016-05-21 DIAGNOSIS — E860 Dehydration: Secondary | ICD-10-CM | POA: Diagnosis present

## 2016-05-21 HISTORY — DX: Chronic kidney disease, stage 3 (moderate): N18.3

## 2016-05-21 HISTORY — DX: Chronic kidney disease, stage 3 unspecified: N18.30

## 2016-05-21 LAB — CBC WITH DIFFERENTIAL/PLATELET
BASOS ABS: 0 10*3/uL (ref 0.0–0.1)
Basophils Relative: 0 %
Eosinophils Absolute: 0 10*3/uL (ref 0.0–0.7)
Eosinophils Relative: 0 %
HEMATOCRIT: 36.3 % — AB (ref 39.0–52.0)
Hemoglobin: 12 g/dL — ABNORMAL LOW (ref 13.0–17.0)
LYMPHS PCT: 12 %
Lymphs Abs: 1 10*3/uL (ref 0.7–4.0)
MCH: 30 pg (ref 26.0–34.0)
MCHC: 33.1 g/dL (ref 30.0–36.0)
MCV: 90.8 fL (ref 78.0–100.0)
MONO ABS: 0.7 10*3/uL (ref 0.1–1.0)
MONOS PCT: 8 %
NEUTROS ABS: 7 10*3/uL (ref 1.7–7.7)
Neutrophils Relative %: 80 %
Platelets: 161 10*3/uL (ref 150–400)
RBC: 4 MIL/uL — ABNORMAL LOW (ref 4.22–5.81)
RDW: 13.4 % (ref 11.5–15.5)
WBC: 8.8 10*3/uL (ref 4.0–10.5)

## 2016-05-21 LAB — COMPREHENSIVE METABOLIC PANEL
ALBUMIN: 3.1 g/dL — AB (ref 3.5–5.0)
ALT: 27 U/L (ref 17–63)
ANION GAP: 8 (ref 5–15)
AST: 93 U/L — ABNORMAL HIGH (ref 15–41)
Alkaline Phosphatase: 91 U/L (ref 38–126)
BUN: 17 mg/dL (ref 6–20)
CHLORIDE: 106 mmol/L (ref 101–111)
CO2: 25 mmol/L (ref 22–32)
Calcium: 9.1 mg/dL (ref 8.9–10.3)
Creatinine, Ser: 1.45 mg/dL — ABNORMAL HIGH (ref 0.61–1.24)
GFR calc Af Amer: 49 mL/min — ABNORMAL LOW (ref >60)
GFR calc non Af Amer: 42 mL/min — ABNORMAL LOW (ref >60)
GLUCOSE: 143 mg/dL — AB (ref 65–99)
POTASSIUM: 3.9 mmol/L (ref 3.5–5.1)
SODIUM: 139 mmol/L (ref 135–145)
Total Bilirubin: 1.4 mg/dL — ABNORMAL HIGH (ref 0.3–1.2)
Total Protein: 6 g/dL — ABNORMAL LOW (ref 6.5–8.1)

## 2016-05-21 LAB — I-STAT VENOUS BLOOD GAS, ED
Bicarbonate: 23.8 mEq/L (ref 20.0–24.0)
O2 SAT: 52 %
PCO2 VEN: 39 mmHg — AB (ref 45.0–50.0)
PH VEN: 7.404 — AB (ref 7.250–7.300)
Patient temperature: 103
TCO2: 25 mmol/L (ref 0–100)
pO2, Ven: 31 mmHg (ref 31.0–45.0)

## 2016-05-21 LAB — CK: Total CK: 3513 U/L — ABNORMAL HIGH (ref 49–397)

## 2016-05-21 LAB — I-STAT CG4 LACTIC ACID, ED
LACTIC ACID, VENOUS: 2.63 mmol/L — AB (ref 0.5–1.9)
LACTIC ACID, VENOUS: 1.6 mmol/L (ref 0.5–1.9)

## 2016-05-21 LAB — I-STAT TROPONIN, ED: Troponin i, poc: 0.03 ng/mL (ref 0.00–0.08)

## 2016-05-21 LAB — AMMONIA: AMMONIA: 17 umol/L (ref 9–35)

## 2016-05-21 MED ORDER — ACETAMINOPHEN 650 MG RE SUPP
650.0000 mg | Freq: Once | RECTAL | Status: DC
  Filled 2016-05-21: qty 1

## 2016-05-21 MED ORDER — SODIUM CHLORIDE 0.9 % IV SOLN
INTRAVENOUS | Status: DC
  Administered 2016-05-22 – 2016-05-23 (×3): via INTRAVENOUS

## 2016-05-21 MED ORDER — ACETAMINOPHEN 325 MG PO TABS
650.0000 mg | ORAL_TABLET | Freq: Once | ORAL | Status: DC
  Filled 2016-05-21: qty 2

## 2016-05-21 MED ORDER — ENOXAPARIN SODIUM 40 MG/0.4ML ~~LOC~~ SOLN
40.0000 mg | Freq: Every day | SUBCUTANEOUS | Status: DC
  Administered 2016-05-22 – 2016-05-24 (×3): 40 mg via SUBCUTANEOUS
  Filled 2016-05-21 (×3): qty 0.4

## 2016-05-21 MED ORDER — ACETAMINOPHEN 325 MG PO TABS
650.0000 mg | ORAL_TABLET | Freq: Four times a day (QID) | ORAL | Status: DC | PRN
  Administered 2016-05-22 – 2016-05-23 (×2): 650 mg via ORAL
  Filled 2016-05-21 (×2): qty 2

## 2016-05-21 MED ORDER — PIPERACILLIN-TAZOBACTAM 3.375 G IVPB 30 MIN
3.3750 g | Freq: Once | INTRAVENOUS | Status: AC
  Administered 2016-05-21: 3.375 g via INTRAVENOUS
  Filled 2016-05-21: qty 50

## 2016-05-21 MED ORDER — ONDANSETRON HCL 4 MG/2ML IJ SOLN: 4.0000 mg | Freq: Four times a day (QID) | INTRAMUSCULAR | Status: DC | PRN

## 2016-05-21 MED ORDER — VANCOMYCIN HCL 10 G IV SOLR
2000.0000 mg | Freq: Once | INTRAVENOUS | Status: AC
  Administered 2016-05-21: 2000 mg via INTRAVENOUS
  Filled 2016-05-21: qty 2000

## 2016-05-21 MED ORDER — ACETAMINOPHEN 650 MG RE SUPP
650.0000 mg | Freq: Four times a day (QID) | RECTAL | Status: DC | PRN
Start: 2016-05-21 — End: 2016-05-24
  Administered 2016-05-21: 650 mg via RECTAL

## 2016-05-21 MED ORDER — ONDANSETRON HCL 4 MG PO TABS: 4.0000 mg | ORAL_TABLET | Freq: Four times a day (QID) | ORAL | Status: DC | PRN

## 2016-05-21 MED ORDER — ASPIRIN 300 MG RE SUPP
150.0000 mg | Freq: Every day | RECTAL | Status: DC
  Administered 2016-05-22: 150 mg via RECTAL
  Filled 2016-05-21: qty 1

## 2016-05-21 MED ORDER — SODIUM CHLORIDE 0.9 % IV BOLUS (SEPSIS)
1000.0000 mL | Freq: Once | INTRAVENOUS | Status: DC
Start: 2016-05-21 — End: 2016-05-24

## 2016-05-21 MED ORDER — SODIUM CHLORIDE 0.9 % IV BOLUS (SEPSIS)
1000.0000 mL | Freq: Once | INTRAVENOUS | Status: AC
  Administered 2016-05-21: 1000 mL via INTRAVENOUS

## 2016-05-21 MED ORDER — PIPERACILLIN-TAZOBACTAM 3.375 G IVPB
3.3750 g | Freq: Three times a day (TID) | INTRAVENOUS | Status: DC
  Administered 2016-05-22 – 2016-05-23 (×6): 3.375 g via INTRAVENOUS
  Filled 2016-05-21 (×8): qty 50

## 2016-05-21 MED ORDER — VANCOMYCIN HCL 10 G IV SOLR: 1250.0000 mg | INTRAVENOUS | Status: DC

## 2016-05-21 MED ORDER — LEVOTHYROXINE SODIUM 100 MCG IV SOLR: 62.5000 ug | Freq: Every day | INTRAVENOUS | Status: DC

## 2016-05-21 MED ORDER — SODIUM CHLORIDE 0.9% FLUSH
3.0000 mL | Freq: Two times a day (BID) | INTRAVENOUS | Status: DC
  Administered 2016-05-22 – 2016-05-24 (×3): 3 mL via INTRAVENOUS

## 2016-05-21 NOTE — ED Triage Notes (Signed)
Pt arrives [facility] with c/o fall to floor from standing and remains there for 4 hours because he did not want his wife to call.C/O generalized weakness. Pt on plavix for MI.
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
## 2016-05-21 NOTE — ED Notes (Signed)
IV attemopted x 2 with lab draw. Successful on second but pt pulled oiv out.

## 2016-05-21 NOTE — Progress Notes (Addendum)
Pharmacy Antibiotic Note  Richard Jacobs is a 82 y.o. male admitted on 05/21/2016 weakness s/p fall, possible sepsis .  Pharmacy has been consulted for Vancomycin and Zosyn dosing.  Pt on Xtandi for treatment of prostate cancer.  Vancomycin 2 g IV given in ED at 1900  Plan: Vancomycin 1250 mg IV q48h Zosyn 3.375 g IV q8h   Height: 5\' 8"  (172.7 cm) Weight: 200 lb (90.7 kg) IBW/kg (Calculated) : 68.4  Temp (24hrs), Avg:102.2 F (39 C), Min:101.7 F (38.7 C), Max:103.1 F (39.5 C)   Recent Labs Lab 05/21/16 1810 05/21/16 1845 05/21/16 1900  WBC 8.8  --   --   CREATININE 1.45*  --   --   LATICACIDVEN  --  2.63* 1.60    Estimated Creatinine Clearance: 40 mL/min (by C-G formula based on SCr of 1.45 mg/dL).    No Known Allergies  Caryl Pina 05/21/2016 11:19 PM

## 2016-05-21 NOTE — ED Notes (Signed)
Pt moved to room 17 and Resp at bedside
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
## 2016-05-22 ENCOUNTER — Encounter (HOSPITAL_COMMUNITY): Payer: Self-pay | Admitting: *Deleted

## 2016-05-22 DIAGNOSIS — T796XXA Traumatic ischemia of muscle, initial encounter: Secondary | ICD-10-CM | POA: Diagnosis not present

## 2016-05-22 DIAGNOSIS — J189 Pneumonia, unspecified organism: Secondary | ICD-10-CM

## 2016-05-22 DIAGNOSIS — A419 Sepsis, unspecified organism: Secondary | ICD-10-CM | POA: Diagnosis not present

## 2016-05-22 LAB — PROCALCITONIN: Procalcitonin: 0.27 ng/mL

## 2016-05-22 LAB — URINE MICROSCOPIC-ADD ON

## 2016-05-22 LAB — BASIC METABOLIC PANEL
Anion gap: 7 (ref 5–15)
BUN: 17 mg/dL (ref 6–20)
CHLORIDE: 109 mmol/L (ref 101–111)
CO2: 23 mmol/L (ref 22–32)
CREATININE: 1.42 mg/dL — AB (ref 0.61–1.24)
Calcium: 8.4 mg/dL — ABNORMAL LOW (ref 8.9–10.3)
GFR, EST AFRICAN AMERICAN: 50 mL/min — AB (ref >60)
GFR, EST NON AFRICAN AMERICAN: 43 mL/min — AB (ref >60)
Glucose, Bld: 152 mg/dL — ABNORMAL HIGH (ref 65–99)
POTASSIUM: 3.7 mmol/L (ref 3.5–5.1)
SODIUM: 139 mmol/L (ref 135–145)

## 2016-05-22 LAB — URINALYSIS, ROUTINE W REFLEX MICROSCOPIC
BILIRUBIN URINE: NEGATIVE
Glucose, UA: NEGATIVE mg/dL
KETONES UR: NEGATIVE mg/dL
Leukocytes, UA: NEGATIVE
NITRITE: NEGATIVE
Protein, ur: 30 mg/dL — AB
Specific Gravity, Urine: 1.022 (ref 1.005–1.030)
pH: 5 (ref 5.0–8.0)

## 2016-05-22 LAB — CBC
HEMATOCRIT: 34.2 % — AB (ref 39.0–52.0)
Hemoglobin: 11.1 g/dL — ABNORMAL LOW (ref 13.0–17.0)
MCH: 29.4 pg (ref 26.0–34.0)
MCHC: 32.5 g/dL (ref 30.0–36.0)
MCV: 90.7 fL (ref 78.0–100.0)
PLATELETS: 146 10*3/uL — AB (ref 150–400)
RBC: 3.77 MIL/uL — AB (ref 4.22–5.81)
RDW: 13.3 % (ref 11.5–15.5)
WBC: 8.1 10*3/uL (ref 4.0–10.5)

## 2016-05-22 LAB — MAGNESIUM: MAGNESIUM: 1.6 mg/dL — AB (ref 1.7–2.4)

## 2016-05-22 LAB — GLUCOSE, CAPILLARY: GLUCOSE-CAPILLARY: 137 mg/dL — AB (ref 65–99)

## 2016-05-22 LAB — CK: Total CK: 3735 U/L — ABNORMAL HIGH (ref 49–397)

## 2016-05-22 LAB — BRAIN NATRIURETIC PEPTIDE: B Natriuretic Peptide: 408.9 pg/mL — ABNORMAL HIGH (ref 0.0–100.0)

## 2016-05-22 LAB — STREP PNEUMONIAE URINARY ANTIGEN: STREP PNEUMO URINARY ANTIGEN: NEGATIVE

## 2016-05-22 LAB — APTT: APTT: 32 s (ref 24–37)

## 2016-05-22 LAB — PROTIME-INR
INR: 1.2 (ref 0.00–1.49)
Prothrombin Time: 15.3 seconds — ABNORMAL HIGH (ref 11.6–15.2)

## 2016-05-22 LAB — LACTIC ACID, PLASMA: Lactic Acid, Venous: 1.1 mmol/L (ref 0.5–1.9)

## 2016-05-22 MED ORDER — CETYLPYRIDINIUM CHLORIDE 0.05 % MT LIQD
7.0000 mL | Freq: Two times a day (BID) | OROMUCOSAL | Status: DC
  Administered 2016-05-22 – 2016-05-24 (×3): 7 mL via OROMUCOSAL

## 2016-05-22 MED ORDER — DEXTROSE 5 % IV SOLN
500.0000 mg | INTRAVENOUS | Status: DC
  Administered 2016-05-22 – 2016-05-23 (×2): 500 mg via INTRAVENOUS
  Filled 2016-05-22 (×2): qty 500

## 2016-05-22 MED ORDER — LEVOTHYROXINE SODIUM 112 MCG PO TABS
112.0000 ug | ORAL_TABLET | Freq: Every day | ORAL | Status: DC
  Administered 2016-05-22 – 2016-05-23 (×2): 112 ug via ORAL
  Filled 2016-05-22 (×2): qty 1

## 2016-05-22 MED ORDER — ASPIRIN 81 MG PO CHEW
81.0000 mg | CHEWABLE_TABLET | Freq: Every day | ORAL | Status: DC
  Administered 2016-05-23 – 2016-05-24 (×2): 81 mg via ORAL
  Filled 2016-05-22 (×3): qty 1

## 2016-05-22 MED ORDER — VANCOMYCIN HCL 10 G IV SOLR
1250.0000 mg | INTRAVENOUS | Status: DC
  Administered 2016-05-22 – 2016-05-23 (×2): 1250 mg via INTRAVENOUS
  Filled 2016-05-22 (×2): qty 1250

## 2016-05-22 MED ORDER — MAGNESIUM SULFATE IN D5W 1-5 GM/100ML-% IV SOLN
1.0000 g | Freq: Once | INTRAVENOUS | Status: AC
  Administered 2016-05-22: 1 g via INTRAVENOUS
  Filled 2016-05-22: qty 100
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
## 2016-05-22 NOTE — Evaluation (Signed)
Occupational Therapy Evaluation Patient Details Name: Richard Jacobs MRN: [id number] DOB: 04/04/1928 Today's Date: 05/22/2016    History of Present Illness Pt is an 82 y/o male w/ h/o prostate cancer w/ bone mets, prior R MCA w/ L sided weakness, hypothyroidism, please see chart for entire PMH. Pt was admitted to hospital after fall at home and dx: Sepsis.    Clinical Impression   Pt is an 82 y/o R HD male admitted as above w/ significant PMH, currently demonstrating deficits in his ability to perform ADL's and self care tasks (see OT problem list below) whom should benefit from acute OT to assist in maximizing independence for anticipated d/c home w/ HHOT (family currently declines SNF Rehab).    Follow Up Recommendations  Home health OT;Supervision/Assistance - 24 hour;Other (comment) (Family declines SNF rehab at this time)    Equipment Recommendations  3 in 1 bedside comode;Tub/shower seat    Recommendations for Other Services PT consult     Precautions / Restrictions Precautions Precautions: Fall;Other (comment) Precaution Comments: Cardiac Restrictions Weight Bearing Restrictions: No      Mobility Bed Mobility Overal bed mobility: Needs Assistance Bed Mobility: Supine to Sit     Supine to sit: Min assist;HOB elevated     General bed mobility comments: Min A LE's and vc's w/ increased time. Used bed rails  Transfers Overall transfer level: Needs assistance Equipment used: Rolling walker (2 wheeled) Transfers: Sit to/from [facility] Sit to Stand: Min assist;+2 physical assistance Stand pivot transfers: Min assist;+2 physical assistance       General transfer comment: Simulated toilet transfer from EOB to recliner in room today. Pt report LE weakness. Attempted w/ RW x3 followed by Min A x2 for SPT - increased time for all mobility; pt w/ fear of falls.    Balance Overall balance assessment: Needs assistance Sitting-balance support:  Bilateral upper extremity supported;Feet supported Sitting balance-Leahy Scale: Fair     Standing balance support: Bilateral upper extremity supported Standing balance-Leahy Scale: Poor                              ADL Overall ADL's : Needs assistance/impaired Eating/Feeding:  (Waiting on speech assessment due reports of difficulty swallowing yesterday per spouse)   Grooming: Wash/dry hands;Wash/dry face;Oral care;Set up;Sitting;Min guard   Upper Body Bathing: Set up;Min guard;Sitting   Lower Body Bathing: Maximal assistance;Sitting/lateral leans   Upper Body Dressing : Set up;Min guard;Sitting   Lower Body Dressing: +2 for physical assistance;Minimal assistance;Sit to/from Health and safety inspector Details (indicate cue type and reason): Simulated toilet transfer from EOB to recliner in room today. Pt report LE weakness. Attempted w/ RW x3 followed by Min A x2 for SPT - increased time for all mobility; pt w/ fear of falls. Toileting- Clothing Manipulation and Hygiene: Set up;Minimal assistance;Sitting/lateral lean       Functional mobility during ADLs: Minimal assistance;+2 for physical assistance;Cueing for safety;Cueing for sequencing;Rolling walker General ADL Comments: Pt & spouse were educated in role of OT followed by grooming and UB ADL's sitting up in chair. Spouse reports that pt was Independent w/ ADL's sitting at sink prior to this admission, states that he seldom gets into shower due to LE weakness. He should benefit from acute OT for ADL retraining followed by [facility]  for home safety assessment and recemmendations.     Vision  Wears glasses at all times, spouse to bring in to  hospital. No change from baseline per pt report   Perception     Praxis      Pertinent Vitals/Pain Pain Assessment: No/denies pain     Hand Dominance Right   Extremity/Trunk Assessment Upper Extremity Assessment Upper Extremity Assessment: Generalized weakness (Prior h/o L  sided weakness following R MCA )   Lower Extremity Assessment Lower Extremity Assessment: Defer to PT evaluation;Generalized weakness       Communication Communication Communication: No difficulties   Cognition Arousal/Alertness: Awake/alert Behavior During Therapy: WFL for tasks assessed/performed Overall Cognitive Status: Impaired/Different from baseline Area of Impairment: Memory (Pt spouse corrects pt at times during assessment and reports AMS)                   General Comments   Generalized weakness throughout, increased time for tasks and to follow commands but WFL's for tasks assessed.    Exercises       Shoulder Instructions      Home Living Family/patient expects to be discharged to:: Private residence Living Arrangements: Spouse/significant other Available Help at Discharge: Family Type of Home: House Home Access: Stairs to enter [employer] of Steps: 2=3  Entrance Stairs-Rails: None       Bathroom Shower/Tub: Tub/shower unit;Walk-in shower   Bathroom Toilet: Standard     Home Equipment: Cane - single point;Walker - 2 wheels          Prior Functioning/Environment Level of Independence: Independent  Gait / Transfers Assistance Needed: Pt ambulated out of home w/ RW per spouse report. In home w/ or w/o RW independently per pt and spouse ADL's / Homemaking Assistance Needed: Pt performed bathing and dressing seated at sink at baseline - seldom gets into shower secondary to LE weakness        OT Diagnosis: Generalized weakness   OT Problem List: Decreased strength;Decreased knowledge of use of DME or AE;Decreased knowledge of precautions;Decreased activity tolerance;Decreased cognition;Cardiopulmonary status limiting activity;Impaired balance (sitting and/or standing)   OT Treatment/Interventions: Self-care/ADL training;Balance training;Therapeutic activities;DME and/or AE instruction;Patient/family education    OT Goals(Current  goals can be found in the care plan section) Acute Rehab OT Goals Patient Stated Goal: LE's stronger Time For Goal Achievement: 06/05/16 Potential to Achieve Goals: Good  OT Frequency: Min 2X/week   Barriers to D/C:            Co-evaluation              End of Session Equipment Utilized During Treatment: Gait belt;Rolling walker Nurse Communication: Mobility status  Activity Tolerance: Patient tolerated treatment well Patient left: in chair;with call bell/phone within reach;with chair alarm set;with family/visitor present   Time: [id number] OT Time Calculation (min): 51 min Charges:  OT General Charges $OT Visit: 1 Procedure OT Evaluation $OT Eval Moderate Complexity: 1 Procedure OT Treatments $Self Care/Home Management : 8-22 mins G-Codes: OT G-codes **NOT FOR INPATIENT CLASS** Functional Assessment Tool Used: Clinical judgement/ADL's Functional Limitation: Self care Self Care Current Status [id number]): At least 40 percent but less than 60 percent impaired, limited or restricted Self Care Goal Status [id number]): At least 20 percent but less than 40 percent impaired, limited or restricted  Almyra Deforest, OTR/L 05/22/2016, 9:46 AM

## 2016-05-22 NOTE — Progress Notes (Signed)
Physical Therapy Treatment Patient Details Name: Richard Jacobs MRN: [id number] DOB: 02/25/1928 Today's Date: 05/22/2016    History of Present Illness Pt is an 82 y/o male w/ h/o prostate cancer w/ bone mets, prior R MCA w/ L sided weakness, hypothyroidism, please see chart for entire PMH. Pt was admitted to hospital after fall at home and dx: Sepsis.     PT Comments    Pt admitted with/for falls with sepsis.  Pt currently limited functionally due to the problems listed below.  (see problems list.)  Pt will benefit from PT to maximize function and safety to be able to get home safely with available assist of family.   Follow Up Recommendations  Home health PT     Equipment Recommendations  None recommended by PT    Recommendations for Other Services       Precautions / Restrictions Precautions Precautions: Fall;Other (comment) Precaution Comments: Cardiac Restrictions Weight Bearing Restrictions: No    Mobility  Bed Mobility Overal bed mobility: Needs Assistance Bed Mobility: Supine to Sit     Supine to sit: Min assist;HOB elevated     General bed mobility comments: Min A LE's and vc's w/ increased time. Used bed rails  Transfers Overall transfer level: Needs assistance Equipment used: Rolling walker (2 wheeled) Transfers: Sit to/from Stand Sit to Stand: Min assist;+2 safety/equipment         General transfer comment: cues for hand placement and assist to come both forward and up.  Ambulation/Gait Ambulation/Gait assistance: Min guard Ambulation Distance (Feet): 110 Feet (x2) Assistive device: Rolling walker (2 wheeled) Gait Pattern/deviations: Step-through pattern Gait velocity: slower Gait velocity interpretation: Below normal speed for age/gender General Gait Details: short "flat-foot" contact pattern with no truncal dissociation and tending to push RW out too far in front of him.   Stairs            Wheelchair Mobility    Modified Rankin  (Stroke Patients Only)       Balance Overall balance assessment: Needs assistance Sitting-balance support: Feet supported Sitting balance-Leahy Scale: Fair     Standing balance support: No upper extremity supported;Bilateral upper extremity supported Standing balance-Leahy Scale: Poor Standing balance comment: needed the use fo the RW today for steadiness.                    Cognition Arousal/Alertness: Awake/alert Behavior During Therapy: WFL for tasks assessed/performed Overall Cognitive Status: Impaired/Different from baseline Area of Impairment: Memory (Pt spouse corrects pt at times during assessment and reports AMS)                    Exercises      General Comments        Pertinent Vitals/Pain      Home Living Family/patient expects to be discharged to:: Private residence Living Arrangements: Spouse/significant other Available Help at Discharge: Family Type of Home: House Home Access: Stairs to enter Entrance Stairs-Rails: None   Home Equipment: Cane - single point;Walker - 2 wheels      Prior Function Level of Independence: Independent  Gait / Transfers Assistance Needed: Pt ambulated out of home w/ RW per spouse report. In home w/ or w/o RW independently per pt and spouse ADL's / Homemaking Assistance Needed: Pt performed bathing and dressing seated at sink at baseline - seldom gets into shower secondary to LE weakness     PT Goals (current goals can now be found in the care plan section) Acute Rehab  PT Goals Patient Stated Goal: LE's stronger PT Goal Formulation: With patient Time For Goal Achievement: 05/29/16 Potential to Achieve Goals: Good    Frequency  Min 3X/week    PT Plan      Co-evaluation             End of Session   Activity Tolerance: Patient tolerated treatment well Patient left: in chair;with call bell/phone within reach;with bed alarm set     Time: 1450-1522 PT Time Calculation (min) (ACUTE ONLY): 32  min  Charges:  $Gait Training: 8-22 mins                    G Codes:  Functional Assessment Tool Used: clinical judgement Functional Limitation: Mobility: Walking and moving around Mobility: Walking and Moving Around Current Status [id number]): At least 20 percent but less than 40 percent impaired, limited or restricted Mobility: Walking and Moving Around Goal Status [phone]): At least 1 percent but less than 20 percent impaired, limited or restricted   Cheyenne Bordeaux, Tessie Fass 05/22/2016, 4:53 PM  05/22/2016  Donnella Sham, PT [phone]  (pager)

## 2016-05-22 NOTE — Progress Notes (Signed)
Pharmacy Antibiotic Note  Richard Jacobs is a 82 y.o. male admitted on 05/21/2016. Presents with AMS, generalized weakness/fall, and fever. ABX for possible sepsis. CXR 7/24 revealed consolidation left mid-lung. Febrile overnight Tmax 103.1. Currently afebrile, WBC WNL, LA 2.63 now normalized PCT 0.27.   Plan: -Adjust vancomycin from 1250mg  q48h to q24h based on renal fcn -Continue Zosyn 3.375g q8h -F/U cultures -Monitor clinical progress, renal fcn, LOT   Height: 5\' 8"  (172.7 cm) Weight: 200 lb (90.7 kg) IBW/kg (Calculated) : 68.4  Temp (24hrs), Avg:100.8 F (38.2 C), Min:98.1 F (36.7 C), Max:103.1 F (39.5 C)   Recent Labs Lab 05/21/16 1810 05/21/16 1845 05/21/16 1900 05/22/16 0111 05/22/16 0131  WBC 8.8  --   --  8.1  --   CREATININE 1.45*  --   --  1.42*  --   LATICACIDVEN  --  2.63* 1.60  --  1.1    Estimated Creatinine Clearance: 40.8 mL/min (by C-G formula based on SCr of 1.42 mg/dL).    No Known Allergies  Antimicrobials this admission: Vancomycin 7/24 >> Zosyn 7/24 >> Azithro 7/25 >>  Microbiology results: 7/24 BCx: sent 7/24 sputum: sent  Thank you for allowing pharmacy to be a part of this patient's care.  Stephens November, PharmD Clinical Pharmacist  2:27 PM, 05/22/2016

## 2016-05-22 NOTE — Progress Notes (Addendum)
Pt.had 6 beats of V TACH.V/S taken ;b/p=113/53;hr=87;r=22;o2 sat=96 on 4 lnc.pt.is asymptomatic.MD on call notified ;Dr.Niu made aware

## 2016-05-22 NOTE — Progress Notes (Addendum)
PROGRESS NOTE    Richard Jacobs  [id number] DOB: 02/15/1928 DOA: 05/21/2016 PCP: Geoffery Lyons, MD   Brief Narrative:  Richard Jacobs is a pleasant 82 year old gentleman with a past medical history of chronic kidney disease, history of left-sided stroke, prostate cancer, currently residing in the community with his wife, admitted to the medicine service on 05/21/2016, presented with mental status changes, fever having temperature 103.1, family members reporting D functional decline. Chest x-ray on admission revealed left mid lung consolidation consistent with pneumonia. He was started on broad-spectrum IV antibiotic therapy with vancomycin and Zosyn.   Assessment & Plan:   Principal Problem:   Sepsis ([place]) Active Problems:   CVA (cerebral infarction)   Malignant neoplasm of prostate ([place])   Hypothyroidism   Rhabdomyolysis   Acute renal failure superimposed on stage 3 chronic kidney disease ([place])   Fall   1.  Sepsis -Present on admission, evidenced by metabolic encephalopathy, temperature 103.1, lactic acid of 2.63, with source of infection likely coming from pneumonia. -Continue broad-spectrum IV antibiotic therapy, IV fluids, supportive care -Blood cultures pending -Lactic acid level trending down to 1.1  2.  Community-acquired pneumonia -He presented with functional decline, failure to thrive, recurrent falls at home and mental status changes. Workup in the emergency room overnight included chest x-ray showing consolidation involving left midlung -Overnight was started on broad-spectrum IV antibiotic therapy with Vancomycin and Zosyn, I added Azithromycin for atypical coverage. I think we can narrow his antibiotic spectrum in a.m. if he continues to show improvement and cultures remain negative. -Pending urine legionella and strep  3.  Functional decline/failure to thrive -This is likely precipitated by infectious process, as he presents with sepsis in context of  pneumonia. -Had a fall at home -Today he was assisted out of bed, ambulated to the hallway and back, seems to be showing some improvement -Continue empiric IV antibiotic therapy, IV fluids, mobility while in the hospital -Family's goal is to have him home  4. Rhabdomyolysis. -Likely secondary to was fall as lab work revealed a CK of [place] IV fluid hydration with normal saline  5.  Hypomagnesemia -Lab work showing magnesium of 1.6, will give 1 g of IV magnesium sulfate  6.  Hypothyroidism -Continue Synthroid 112 g by mouth daily   DVT prophylaxis: Enoxaparin 40 mg subcutaneous daily Code Status: Full code Family Communication: I spoke to his wife Disposition Plan: Family members wish to take him home with home health services when he is medically stable for discharge  Consultants:   Physical therapy/occupational therapy  Procedures:     Antimicrobials:   Zosyn started on 05/21/16  Vancomycin started on 05/21/16    Subjective: He was assisted out of bed and ambulated to the hallway and back, remains unsteady, frail.   Objective: Vitals:   05/22/16 0046 05/22/16 0442 05/22/16 0524 05/22/16 0832  BP: (!) 144/76 (!) 113/53 (!) 115/52 (!) 112/94  Pulse: 98 87 86 93  Resp: 18  18 16   Temp: 100.1 F (37.8 C)  98.1 F (36.7 C)   TempSrc: Oral  Oral   SpO2: 100% 96% 96% 97%  Weight:      Height:        Intake/Output Summary (Last 24 hours) at 05/22/16 1233 Last data filed at 05/22/16 0830  Gross per 24 hour  Intake              585 ml  Output  100 ml  Net              485 ml   Filed Weights   05/21/16 1709  Weight: 90.7 kg (200 lb)    Examination:  General exam: Chronically ill-appearing, no acute distress, per family members seems to be doing better Respiratory system: Left-sided rhonchi, breathing comfortably on room air now Cardiovascular system: S1 & S2 heard, RRR. No JVD, murmurs, rubs, gallops or clicks. No pedal  edema. Gastrointestinal system: Abdomen is nondistended, soft and nontender. No organomegaly or masses felt. Normal bowel sounds heard. Central nervous system: Alert and oriented. No focal neurological deficits. Extremities: Symmetric 5 x 5 power. Skin: No rashes, lesions or ulcers Psychiatry: Judgement and insight appear normal. Mood & affect appropriate.     Data Reviewed: I have personally reviewed following labs and imaging studies  CBC:  Recent Labs Lab 05/21/16 1810 05/22/16 0111  WBC 8.8 8.1  NEUTROABS 7.0  --   HGB 12.0* 11.1*  HCT 36.3* 34.2*  MCV 90.8 90.7  PLT 161 [id number]*   Basic Metabolic Panel:  Recent Labs Lab 05/21/16 1810 05/22/16 0111  NA 139 139  K 3.9 3.7  CL 106 109  CO2 25 23  GLUCOSE 143* 152*  BUN 17 17  CREATININE 1.45* 1.42*  CALCIUM 9.1 8.4*  MG  --  1.6*   GFR: Estimated Creatinine Clearance: 40.8 mL/min (by C-G formula based on SCr of 1.42 mg/dL). Liver Function Tests:  Recent Labs Lab 05/21/16 1810  AST 93*  ALT 27  ALKPHOS 91  BILITOT 1.4*  PROT 6.0*  ALBUMIN 3.1*   No results for input(s): LIPASE, AMYLASE in the last 168 hours.  Recent Labs Lab 05/21/16 1910  AMMONIA 17   Coagulation Profile:  Recent Labs Lab 05/22/16 0111  INR 1.20   Cardiac Enzymes:  Recent Labs Lab 05/21/16 1810 05/22/16 0111  CKTOTAL 3,513* 3,735*   BNP (last 3 results) No results for input(s): PROBNP in the last 8760 hours. HbA1C: No results for input(s): HGBA1C in the last 72 hours. CBG:  Recent Labs Lab 05/22/16 0842  GLUCAP 137*   Lipid Profile: No results for input(s): CHOL, HDL, LDLCALC, TRIG, CHOLHDL, LDLDIRECT in the last 72 hours. Thyroid Function Tests: No results for input(s): TSH, T4TOTAL, FREET4, T3FREE, THYROIDAB in the last 72 hours. Anemia Panel: No results for input(s): VITAMINB12, FOLATE, FERRITIN, TIBC, IRON, RETICCTPCT in the last 72 hours. Sepsis Labs:  Recent Labs Lab 05/21/16 1845 05/21/16 1900  05/22/16 0111 05/22/16 0131  PROCALCITON  --   --  0.27  --   LATICACIDVEN 2.63* 1.60  --  1.1    No results found for this or any previous visit (from the past 240 hour(s)).       Radiology Studies: Ct Head Wo Contrast  Result Date: 05/21/2016 CLINICAL DATA:  Post fall, now with confusion. History of prior right MCA distribution infarct. EXAM: CT HEAD WITHOUT CONTRAST TECHNIQUE: Contiguous axial images were obtained from the base of the skull through the vertex without intravenous contrast. COMPARISON:  10/02/2013; brain MRI - 10/02/2013 FINDINGS: Brain: Atrophy with sulcal prominence and centralized volume loss with commensurate expected dilatation of the ventricular system, progressed since the 09/2013 examination. Rather extensive periventricular hypodensities compatible microvascular ischemic disease, most conspicuous within the right centrum semiovale (image 23, series 201), also progressed since the 10/02/2013 examination. Given extensive background parenchymal abnormalities, there is no CT evidence superimposed acute large territory infarct. No intraparenchymal or extra-axial mass or hemorrhage.  Normal configuration of the ventricles and basilar cisterns. No midline shift. Vascular: Intracranial atherosclerosis Skull: No displaced calvarial fracture. Sinuses/Orbits: There is complete opacification of the right frontal and anterior ethmoidal air cells with polypoid mucosal thickening involving the imaged portions of the right maxillary sinus. The remaining paranasal sinuses and mastoid air cells are normally aerated. No air-fluid levels. Other: Regional soft tissues appear normal. IMPRESSION: 1. Progressive atrophy and microvascular ischemic disease without acute intracranial process. 2. Sinus disease as above.  No air-fluid levels. Electronically Signed   By: Sandi Mariscal M.D.   On: 05/21/2016 19:49  Dg Chest Port 1 View  Result Date: 05/21/2016 CLINICAL DATA:  82 year old male fell to  floor from standing position. Generalize weakness. Fever. Initial encounter. EXAM: PORTABLE CHEST 1 VIEW COMPARISON:  10/02/2013 chest x-ray. FINDINGS: Probable skin folds bilaterally rather than pneumothorax. No obvious rib fracture. Consolidation projects over the lateral aspect of the left mid lung where overlying lead is noted. Follow-up two-view chest or single chest with leads removed recommended for further delineation. Calcified tortuous aorta. Cardiomegaly. Mild elevation left hemidiaphragm. Mild bilateral shoulder joint degenerative changes. IMPRESSION: Consolidation projects over the lateral aspect of the left mid lung where overlying lead is noted. Follow-up two-view chest or single chest with leads removed recommended for further delineation. Calcified tortuous aorta.  Aortic atherosclerosis. Cardiomegaly. Probable skin folds bilaterally rather than pneumothorax. No obvious rib fracture. Electronically Signed   By: Genia Del M.D.   On: 05/21/2016 18:18       Scheduled Meds: . antiseptic oral rinse  7 mL Mouth Rinse BID  . aspirin  81 mg Oral Daily  . enoxaparin (LOVENOX) injection  40 mg Subcutaneous Daily  . levothyroxine  112 mcg Oral QAC breakfast  . piperacillin-tazobactam (ZOSYN)  IV  3.375 g Intravenous Q8H  . sodium chloride  1,000 mL Intravenous Once  . sodium chloride flush  3 mL Intravenous Q12H  . [START ON 05/23/2016] vancomycin  1,250 mg Intravenous Q48H   Continuous Infusions: . sodium chloride 75 mL/hr at 05/22/16 0122     LOS: 1 day    Time spent: 58 min    Kelvin Cellar, MD [facility] Pager [phone]  If 7PM-7AM, please contact night-coverage [email or web address] Password [facility] 05/22/2016, 12:33 PM

## 2016-05-23 ENCOUNTER — Observation Stay (HOSPITAL_COMMUNITY): Payer: Medicare Other

## 2016-05-23 DIAGNOSIS — G9341 Metabolic encephalopathy: Secondary | ICD-10-CM | POA: Diagnosis present

## 2016-05-23 DIAGNOSIS — E876 Hypokalemia: Secondary | ICD-10-CM | POA: Diagnosis present

## 2016-05-23 DIAGNOSIS — Z8546 Personal history of malignant neoplasm of prostate: Secondary | ICD-10-CM | POA: Diagnosis not present

## 2016-05-23 DIAGNOSIS — C61 Malignant neoplasm of prostate: Secondary | ICD-10-CM

## 2016-05-23 DIAGNOSIS — I639 Cerebral infarction, unspecified: Secondary | ICD-10-CM | POA: Diagnosis not present

## 2016-05-23 DIAGNOSIS — R296 Repeated falls: Secondary | ICD-10-CM | POA: Diagnosis present

## 2016-05-23 DIAGNOSIS — C7951 Secondary malignant neoplasm of bone: Secondary | ICD-10-CM | POA: Diagnosis present

## 2016-05-23 DIAGNOSIS — E860 Dehydration: Secondary | ICD-10-CM | POA: Diagnosis present

## 2016-05-23 DIAGNOSIS — M6282 Rhabdomyolysis: Secondary | ICD-10-CM | POA: Diagnosis present

## 2016-05-23 DIAGNOSIS — Z7902 Long term (current) use of antithrombotics/antiplatelets: Secondary | ICD-10-CM | POA: Diagnosis not present

## 2016-05-23 DIAGNOSIS — A419 Sepsis, unspecified organism: Secondary | ICD-10-CM | POA: Diagnosis present

## 2016-05-23 DIAGNOSIS — I350 Nonrheumatic aortic (valve) stenosis: Secondary | ICD-10-CM | POA: Diagnosis present

## 2016-05-23 DIAGNOSIS — T796XXD Traumatic ischemia of muscle, subsequent encounter: Secondary | ICD-10-CM

## 2016-05-23 DIAGNOSIS — E039 Hypothyroidism, unspecified: Secondary | ICD-10-CM | POA: Diagnosis present

## 2016-05-23 DIAGNOSIS — Z720 Tobacco use: Secondary | ICD-10-CM | POA: Diagnosis not present

## 2016-05-23 DIAGNOSIS — G9340 Encephalopathy, unspecified: Secondary | ICD-10-CM | POA: Diagnosis present

## 2016-05-23 DIAGNOSIS — Z800 Family history of malignant neoplasm of digestive organs: Secondary | ICD-10-CM | POA: Diagnosis not present

## 2016-05-23 DIAGNOSIS — I69354 Hemiplegia and hemiparesis following cerebral infarction affecting left non-dominant side: Secondary | ICD-10-CM | POA: Diagnosis not present

## 2016-05-23 DIAGNOSIS — W1830XA Fall on same level, unspecified, initial encounter: Secondary | ICD-10-CM | POA: Diagnosis present

## 2016-05-23 DIAGNOSIS — N179 Acute kidney failure, unspecified: Secondary | ICD-10-CM | POA: Diagnosis present

## 2016-05-23 DIAGNOSIS — R627 Adult failure to thrive: Secondary | ICD-10-CM | POA: Diagnosis present

## 2016-05-23 DIAGNOSIS — N183 Chronic kidney disease, stage 3 (moderate): Secondary | ICD-10-CM | POA: Diagnosis present

## 2016-05-23 DIAGNOSIS — Z79899 Other long term (current) drug therapy: Secondary | ICD-10-CM | POA: Diagnosis not present

## 2016-05-23 DIAGNOSIS — J189 Pneumonia, unspecified organism: Secondary | ICD-10-CM | POA: Diagnosis present

## 2016-05-23 LAB — BASIC METABOLIC PANEL
ANION GAP: 10 (ref 5–15)
BUN: 15 mg/dL (ref 6–20)
CALCIUM: 8.4 mg/dL — AB (ref 8.9–10.3)
CHLORIDE: 108 mmol/L (ref 101–111)
CO2: 23 mmol/L (ref 22–32)
CREATININE: 1.42 mg/dL — AB (ref 0.61–1.24)
GFR, EST AFRICAN AMERICAN: 50 mL/min — AB (ref >60)
GFR, EST NON AFRICAN AMERICAN: 43 mL/min — AB (ref >60)
Glucose, Bld: 117 mg/dL — ABNORMAL HIGH (ref 65–99)
POTASSIUM: 3.4 mmol/L — AB (ref 3.5–5.1)
SODIUM: 141 mmol/L (ref 135–145)

## 2016-05-23 LAB — LEGIONELLA PNEUMOPHILA SEROGP 1 UR AG: L. pneumophila Serogp 1 Ur Ag: NEGATIVE

## 2016-05-23 LAB — CBC
HCT: 30.8 % — ABNORMAL LOW (ref 39.0–52.0)
HEMOGLOBIN: 10 g/dL — AB (ref 13.0–17.0)
MCH: 29.7 pg (ref 26.0–34.0)
MCHC: 32.5 g/dL (ref 30.0–36.0)
MCV: 91.4 fL (ref 78.0–100.0)
PLATELETS: 147 10*3/uL — AB (ref 150–400)
RBC: 3.37 MIL/uL — AB (ref 4.22–5.81)
RDW: 13.7 % (ref 11.5–15.5)
WBC: 7.6 10*3/uL (ref 4.0–10.5)

## 2016-05-23 LAB — CK: CK TOTAL: 1867 U/L — AB (ref 49–397)

## 2016-05-23 MED ORDER — POTASSIUM CHLORIDE CRYS ER 20 MEQ PO TBCR
40.0000 meq | EXTENDED_RELEASE_TABLET | ORAL | Status: AC
Start: 2016-05-23 — End: 2016-05-24
  Administered 2016-05-24 (×2): 40 meq via ORAL
  Filled 2016-05-23 (×2): qty 2

## 2016-05-23 MED ORDER — LEVOFLOXACIN 750 MG PO TABS
750.0000 mg | ORAL_TABLET | ORAL | Status: DC
  Administered 2016-05-24: 750 mg via ORAL
  Filled 2016-05-23: qty 1

## 2016-05-23 NOTE — Progress Notes (Signed)
PROGRESS NOTE    Richard Jacobs  [id number] DOB: [birth year] DOA: 05/21/2016 PCP: Geoffery Lyons, MD   Brief Narrative:  [name] is a pleasant 82 year old gentleman with a past medical history of chronic kidney disease, history of left-sided stroke, prostate cancer, currently residing in the community with his wife, admitted to the medicine service on 05/21/2016, presented with mental status changes, fever having temperature 103.1, family members reporting D functional decline. Chest x-ray on admission revealed left mid lung consolidation consistent with pneumonia. He was started on broad-spectrum IV antibiotic therapy with vancomycin and Zosyn.   Assessment & Plan:   Principal Problem:   Sepsis ([place]) Active Problems:   Malignant neoplasm of prostate ([place])   CVA (cerebral infarction)   Hypothyroidism   Rhabdomyolysis   Acute renal failure superimposed on stage 3 chronic kidney disease ([place])   Fall   1.  Sepsis due to PNA -Present on admission, evidenced by metabolic encephalopathy, temperature 103.1, lactic acid of 2.63, with source of infection likely coming from pneumonia -Blood cultures w/o growth -Lactic acid level WNL now -will transition/narrow antibiotics -if stable home in am  2.  Community-acquired pneumonia -He presented with functional decline, failure to thrive, recurrent falls at home and mental status changes. Workup in the emergency room overnight included chest x-ray showing consolidation involving left midlung -Overnight was started on broad-spectrum IV antibiotic therapy with Vancomycin and Zosyn, I added Azithromycin for atypical coverage. -has remained stable -will transition to levaquin (per pharmacy dosing) -if stable will discharge home on 7/27  3.  Functional decline/failure to thrive -This is likely precipitated by infectious process, as he presents with sepsis in context of pneumonia. -Had a fall at home, prior to admission -seen by PT  who recommended [facility] services at discharge -family looking to provide care/needed assistance  4. Mild Rhabdomyolysis. -Likely secondary to recent fall and muscle trauma -CK levels improved -IVF's rate adjusted   5.  Hypomagnesemia and hypokalemia -continue repletion as needed  -will check Mg and BMET in am  6.  Hypothyroidism -Continue Synthroid 112 g by mouth daily   DVT prophylaxis: Enoxaparin 40 mg subcutaneous daily Code Status: Full code Family Communication: no family at bedside  Disposition Plan: back home with home health services. Anticipate discharge on 7/27 (if he remains stable)  Consultants:   None   Procedures:     Antimicrobials:   Zosyn started on 05/21/16  Vancomycin started on 05/21/16    Subjective: Patient is afebrile, denies CP and SOB. No nausea or vomiting. Feeling better.  Objective: Vitals:   05/22/16 2300 05/23/16 0551 05/23/16 1352 05/23/16 2118  BP:  124/65 (!) 101/59 131/66  Pulse:  85 81 91  Resp:  17  17  Temp: 98.4 F (36.9 C) 99 F (37.2 C) 98.7 F (37.1 C) (!) 100.8 F (38.2 C)  TempSrc: Oral Oral Oral Oral  SpO2:  98% 96% 97%  Weight:      Height:        Intake/Output Summary (Last 24 hours) at 05/23/16 2312 Last data filed at 05/23/16 1845  Gross per 24 hour  Intake          2686.25 ml  Output              250 ml  Net          2436.25 ml   Filed Weights   05/21/16 1709  Weight: 90.7 kg (200 lb)    Examination:  General exam: Chronically ill-appearing, in  no acute distress, afebrile and denies CP. Respiratory system: Left-sided rhonchi, breathing comfortably on room air now; no use of accessory muscles Cardiovascular system: S1 & S2 heard, RRR. No JVD, murmurs, rubs, gallops or clicks. No pedal edema. Gastrointestinal system: Abdomen is nondistended, soft and nontender. No organomegaly or masses felt. Normal bowel sounds heard. Central nervous system: Alert and oriented. No focal neurological  deficits. Extremities: Symmetric 5 x 5 power. Skin: No rashes, lesions or ulcers Psychiatry: Judgement and insight appear normal. Mood & affect appropriate.     Data Reviewed: I have personally reviewed following labs and imaging studies  CBC:  Recent Labs Lab 05/21/16 1810 05/22/16 0111 05/23/16 0655  WBC 8.8 8.1 7.6  NEUTROABS 7.0  --   --   HGB 12.0* 11.1* 10.0*  HCT 36.3* 34.2* 30.8*  MCV 90.8 90.7 91.4  PLT 161 146* [id number]*   Basic Metabolic Panel:  Recent Labs Lab 05/21/16 1810 05/22/16 0111 05/23/16 0655  NA 139 139 141  K 3.9 3.7 3.4*  CL 106 109 108  CO2 25 23 23   GLUCOSE 143* 152* 117*  BUN 17 17 15   CREATININE 1.45* 1.42* 1.42*  CALCIUM 9.1 8.4* 8.4*  MG  --  1.6*  --    GFR: Estimated Creatinine Clearance: 40.8 mL/min (by C-G formula based on SCr of 1.42 mg/dL).   Liver Function Tests:  Recent Labs Lab 05/21/16 1810  AST 93*  ALT 27  ALKPHOS 91  BILITOT 1.4*  PROT 6.0*  ALBUMIN 3.1*    Recent Labs Lab 05/21/16 1910  AMMONIA 17   Coagulation Profile:  Recent Labs Lab 05/22/16 0111  INR 1.20   Cardiac Enzymes:  Recent Labs Lab 05/21/16 1810 05/22/16 0111 05/23/16 0655  CKTOTAL 3,513* 3,735* 1,867*   CBG:  Recent Labs Lab 05/22/16 0842  GLUCAP 137*   Sepsis Labs:  Recent Labs Lab 05/21/16 1845 05/21/16 1900 05/22/16 0111 05/22/16 0131  PROCALCITON  --   --  0.27  --   LATICACIDVEN 2.63* 1.60  --  1.1    Recent Results (from the past 240 hour(s))  Blood culture (routine x 2)     Status: None (Preliminary result)   Collection Time: 05/21/16  6:10 PM  Result Value Ref Range Status   Specimen Description BLOOD RIGHT ANTECUBITAL  Final   Special Requests IN PEDIATRIC BOTTLE 4CC  Final   Culture NO GROWTH 2 DAYS  Final   Report Status PENDING  Incomplete  Blood culture (routine x 2)     Status: None (Preliminary result)   Collection Time: 05/21/16  6:52 PM  Result Value Ref Range Status   Specimen Description  BLOOD LEFT ARM  Final   Special Requests   Final    BOTTLES DRAWN AEROBIC AND ANAEROBIC 10CC AER,5CC ANA   Culture NO GROWTH 2 DAYS  Final   Report Status PENDING  Incomplete         Radiology Studies: Dg Chest 2 View  Result Date: 05/23/2016 CLINICAL DATA:  Pneumonia EXAM: CHEST  2 VIEW COMPARISON:  May 21, 2016 FINDINGS: There has been interval partial clearing of infiltrate from the left mid lung. A small amount of patchy opacity does remain in this area. There is also atelectatic change in the left base. There is minimal atelectasis in the lateral right base. Lungs elsewhere clear. Heart is mildly enlarged with pulmonary vascularity within normal limits. There is atherosclerotic calcification in the aorta. No adenopathy is evident. IMPRESSION: Interval partial clearing of  patchy infiltrate left mid lung. Mild bibasilar atelectasis. Stable cardiac silhouette. There is aortic atherosclerosis. Electronically Signed   By: Lowella Grip III M.D.   On: 05/23/2016 07:20   Scheduled Meds: . antiseptic oral rinse  7 mL Mouth Rinse BID  . aspirin  81 mg Oral Daily  . azithromycin  500 mg Intravenous Q24H  . enoxaparin (LOVENOX) injection  40 mg Subcutaneous Daily  . levothyroxine  112 mcg Oral QAC breakfast  . piperacillin-tazobactam (ZOSYN)  IV  3.375 g Intravenous Q8H  . potassium chloride  40 mEq Oral Q4H  . sodium chloride  1,000 mL Intravenous Once  . sodium chloride flush  3 mL Intravenous Q12H  . vancomycin  1,250 mg Intravenous Q24H   Continuous Infusions: . sodium chloride 75 mL/hr at 05/23/16 1131     LOS: 2 days    Time spent: 35 min    Barton Dubois, MD [facility] Pager [phone]  If 7PM-7AM, please contact night-coverage [email or web address] Password [facility] 05/23/2016, 11:12 PM

## 2016-05-23 NOTE — Progress Notes (Signed)
Pharmacy Antibiotic Note  Richard Jacobs is a 82 y.o. male admitted on 05/21/2016. Abx started for sepsis due to PNA. Currently Day #3 of Vanc and Zosyn. MD now changing to Levaquin po per pharmacy.  Plan: -Levaquin 750mg  po q48h - first dose tomorrow at 1300 (24h post Azithromycin dose per P&T policy) -F/U micro data -Monitor clinical progress, renal fxn, LOT   Height: 5\' 8"  (172.7 cm) Weight: 200 lb (90.7 kg) IBW/kg (Calculated) : 68.4  Temp (24hrs), Avg:99.5 F (37.5 C), Min:98.7 F (37.1 C), Max:100.8 F (38.2 C)   Recent Labs Lab 05/21/16 1810 05/21/16 1845 05/21/16 1900 05/22/16 0111 05/22/16 0131 05/23/16 0655  WBC 8.8  --   --  8.1  --  7.6  CREATININE 1.45*  --   --  1.42*  --  1.42*  LATICACIDVEN  --  2.63* 1.60  --  1.1  --     Estimated Creatinine Clearance: 40.8 mL/min (by C-G formula based on SCr of 1.42 mg/dL).    No Known Allergies  Antimicrobials this admission: Vancomycin 7/24 >>7/26  Zosyn 7/24 >>7/26 Azithro 7/25 >> Levaquin 7/27>>  Microbiology results: 7/24 BCx x2: ngtd 7/24 sputum:  7/24 urine:  Thank you for allowing pharmacy to be a part of this patient's care.  Sherlon Handing, PharmD, BCPS Clinical pharmacist, pager [phone] 11:28 PM, 05/23/2016

## 2016-05-23 NOTE — Care Management Obs Status (Deleted)
[place] NOTIFICATION   Patient Details  Name: Richard Jacobs MRN: [id number] Date of Birth: 10-Mar-1928   Medicare Observation Status Notification Given:  Yes   Attempt to contact wife to review, left message. No call back at this time.   Carles Collet, RN 05/23/2016, 12:59 PM

## 2016-05-23 NOTE — Care Management CC44 (Addendum)
Condition Code 44 Documentation Completed  Patient Details  Name: Richard Jacobs MRN: [id number] Date of Birth: 03-05-1928   Condition Code 44 given:  Yes Patient signature on Condition Code 44 notice:  Yes (attempt to contact wife to review, left message, no call back) Documentation of 2 MD's agreement:  Yes Code 44 added to claim:  Yes   Spoke with wife over the phone, verbalized understanding of [place] letter. Left copy in room.   Carles Collet, RN 05/23/2016, 1:01 PM

## 2016-05-24 DIAGNOSIS — J189 Pneumonia, unspecified organism: Secondary | ICD-10-CM

## 2016-05-24 DIAGNOSIS — I639 Cerebral infarction, unspecified: Secondary | ICD-10-CM

## 2016-05-24 LAB — CBC
HCT: 31 % — ABNORMAL LOW (ref 39.0–52.0)
HEMOGLOBIN: 10.2 g/dL — AB (ref 13.0–17.0)
MCH: 29.6 pg (ref 26.0–34.0)
MCHC: 32.9 g/dL (ref 30.0–36.0)
MCV: 89.9 fL (ref 78.0–100.0)
PLATELETS: 159 10*3/uL (ref 150–400)
RBC: 3.45 MIL/uL — AB (ref 4.22–5.81)
RDW: 13.6 % (ref 11.5–15.5)
WBC: 7.2 10*3/uL (ref 4.0–10.5)

## 2016-05-24 LAB — BASIC METABOLIC PANEL
ANION GAP: 7 (ref 5–15)
BUN: 14 mg/dL (ref 6–20)
CO2: 23 mmol/L (ref 22–32)
Calcium: 8.4 mg/dL — ABNORMAL LOW (ref 8.9–10.3)
Chloride: 110 mmol/L (ref 101–111)
Creatinine, Ser: 1.27 mg/dL — ABNORMAL HIGH (ref 0.61–1.24)
GFR, EST AFRICAN AMERICAN: 57 mL/min — AB (ref >60)
GFR, EST NON AFRICAN AMERICAN: 49 mL/min — AB (ref >60)
Glucose, Bld: 103 mg/dL — ABNORMAL HIGH (ref 65–99)
Potassium: 4.2 mmol/L (ref 3.5–5.1)
SODIUM: 140 mmol/L (ref 135–145)

## 2016-05-24 LAB — GLUCOSE, CAPILLARY: Glucose-Capillary: 93 mg/dL (ref 65–99)

## 2016-05-24 LAB — MAGNESIUM: MAGNESIUM: 2 mg/dL (ref 1.7–2.4)

## 2016-05-24 MED ORDER — IPRATROPIUM-ALBUTEROL 20-100 MCG/ACT IN AERS: 1.0000 | INHALATION_SPRAY | Freq: Four times a day (QID) | RESPIRATORY_TRACT | 0 refills | Status: AC | PRN

## 2016-05-24 MED ORDER — ACETAMINOPHEN 325 MG PO TABS: 650.0000 mg | ORAL_TABLET | Freq: Four times a day (QID) | ORAL | 0 refills | Status: AC | PRN

## 2016-05-24 MED ORDER — LEVOFLOXACIN 750 MG PO TABS: 750.0000 mg | ORAL_TABLET | ORAL | 0 refills | Status: DC

## 2016-05-24 NOTE — Progress Notes (Signed)
Occupational Therapy Treatment Patient Details Name: Richard Jacobs MRN: [id number] DOB: [birth year] Today's Date: 05/24/2016    History of present illness Pt is an 82 y/o male w/ h/o prostate cancer w/ bone mets, prior R MCA w/ L sided weakness, hypothyroidism, please see chart for entire PMH. Pt was admitted to hospital after fall at home and dx: Sepsis.    OT comments  Patient making progress towards OT goals, continue plan of care for now. Pt continues to require increased assistance and is mod assist for sit to/from stands, requiring +2 for functional ambulation or any more mobility then just a sit to stand. Depending on progress, pt may need post acute rehab.    Follow Up Recommendations  Home health OT;Supervision/Assistance - 24 hour;Other (comment)    Equipment Recommendations  3 in 1 bedside comode;Tub/shower seat    Recommendations for Other Services  None at this time   Precautions / Restrictions Precautions Precautions: Fall;Other (comment) Precaution Comments: Cardiac Restrictions Weight Bearing Restrictions: No    Mobility Bed Mobility General bed mobility comments: pt found seated in recliner upon OT entering/exiting room  Transfers Overall transfer level: Needs assistance Equipment used: Rolling walker (2 wheeled) Transfers: Sit to/from Stand Sit to Stand: Mod assist         General transfer comment: From recliner, cues for hand placement and safety.     Balance Overall balance assessment: Needs assistance Sitting-balance support: Feet supported;No upper extremity supported Sitting balance-Leahy Scale: Fair     Standing balance support: Bilateral upper extremity supported;During functional activity Standing balance-Leahy Scale: Poor Standing balance comment: Reliant on RW for UE support   ADL Overall ADL's : Needs assistance/impaired         Upper Body Bathing: Set up;Min guard;Sitting   Lower Body Bathing: Moderate assistance;Sit to/from  stand Lower Body Bathing Details (indicate cue type and reason): Mod assist for sit to/from stand  Upper Body Dressing : Set up;Min guard;Sitting   Lower Body Dressing: Moderate assistance;Sit to/from stand Lower Body Dressing Details (indicate cue type and reason): Mod assist for sit to/from stand  General ADL Comments: Pt found seated in recliner. Pt worked on doffing bilateral socks and donning lotion to BLEs. Pt then donned bilateral socks and performed sit to/from stand with mod assist. Pt takes increased time and with labored breathing, vitals OK during session.     Cognition   Behavior During Therapy: WFL for tasks assessed/performed Overall Cognitive Status: No family/caregiver present to determine baseline cognitive functioning   General Comments: Pt oriented to self, location, and situation. Pt stated it was February 21st. Pt able to state year correctly.                  Pertinent Vitals/ Pain       Pain Assessment: No/denies pain         Frequency Min 2X/week     Progress Toward Goals  OT Goals(current goals can now be found in the care plan section)  Progress towards OT goals: Progressing toward goals  Acute Rehab OT Goals Patient Stated Goal: get better Time For Goal Achievement: 06/05/16 Potential to Achieve Goals: Good  Plan Discharge plan remains appropriate       End of Session Equipment Utilized During Treatment: Gait belt;Rolling walker   Activity Tolerance Patient tolerated treatment well   Patient Left in chair;with call bell/phone within reach;with chair alarm set   Nurse Communication Mobility status;Other (comment) (recommendation for [facility])     Time: [id number]  OT Time Calculation (min): 21 min  Charges: OT General Charges $OT Visit: 1 Procedure OT Treatments $Self Care/Home Management : 8-22 mins  Chrys Racer , MS, OTR/L, [place] Pager: [phone]  05/24/2016, 4:18 PM

## 2016-05-24 NOTE — Progress Notes (Signed)
Nsg Discharge Note  Admit Date:  05/21/2016 Discharge date: 05/24/2016   Jenne Pane Salahuddin to be D/C'd Home per MD order.  AVS completed.  Copy for chart, and copy for patient signed, and dated. Patient/caregiver able to verbalize understanding.  Discharge Medication:   Medication List    TAKE these medications   acetaminophen 325 MG tablet Commonly known as:  TYLENOL Take 2 tablets (650 mg total) by mouth every 6 (six) hours as needed for mild pain, fever or headache (or Fever >/= 101).   clopidogrel 75 MG tablet Commonly known as:  PLAVIX TAKE 1 TABLET (75 MG TOTAL) BY MOUTH DAILY.   enzalutamide 40 MG capsule Commonly known as:  XTANDI Take 160 mg by mouth at bedtime.   Ipratropium-Albuterol 20-100 MCG/ACT Aers respimat Commonly known as:  COMBIVENT RESPIMAT Inhale 1 puff into the lungs every 6 (six) hours as needed for shortness of breath.   levofloxacin 750 MG tablet Commonly known as:  LEVAQUIN Take 1 tablet (750 mg total) by mouth every other day.   levothyroxine 112 MCG tablet Commonly known as:  SYNTHROID, LEVOTHROID Take 112 mcg by mouth daily.   VITAMIN D3 PO Take 1 tablet by mouth daily.       Discharge Assessment: Vitals:   05/24/16 0628 05/24/16 1400  BP: 137/76 (!) 118/56  Pulse: 98 97  Resp: 18 19  Temp: 99.3 F (37.4 C) 99.3 F (37.4 C)   Skin clean, dry and intact without evidence of skin break down, no evidence of skin tears noted. IV catheter discontinued intact. Site without signs and symptoms of complications - no redness or edema noted at insertion site, patient denies c/o pain - only slight tenderness at site.  Dressing with slight pressure applied.  D/c Instructions-Education: Discharge instructions given to patient/family with verbalized understanding. D/c education completed with patient/family including follow up instructions, medication list, d/c activities limitations if indicated, with other d/c instructions as indicated by MD -  patient able to verbalize understanding, all questions fully answered. Patient instructed to return to ED, call 911, or call MD for any changes in condition.  Patient escorted via [facility], and D/C home via private auto.  Dimas Chyle, RN 05/24/2016 6:08 PM

## 2016-05-24 NOTE — Care Management Important Message (Signed)
Important Message  Patient Details  Name: Richard Jacobs MRN: [id number] Date of Birth: Feb 20, 1928   Medicare Important Message Given:  Yes    [name] May 05/24/2016, 12:11 PM

## 2016-05-24 NOTE — Care Management Note (Signed)
Case Management Note  Patient Details  Name: Richard Jacobs MRN: [id number] Date of Birth: [birth year]  Subjective/Objective:                 Spoke to patient and family in room. Patient has RW, declines needs for other DME. Will DC to home with wife. Would like to follow with [facility] for [facility] PT OT RN.   Action/Plan:  DC to home with wife, Referral made to [facility] for [facility] PT OT RN.  Expected Discharge Date:                  Expected Discharge Plan:  [place]  In-House Referral:     Discharge planning Services  CM Consult  Post Acute Care Choice:  Home Health Choice offered to:  Patient, Spouse, Adult Children  DME Arranged:  N/A DME Agency:  NA  HH Arranged:  RN, PT, OT HH Agency:  [facility]  Status of Service:  Completed, signed off  If discussed at [place] of Stay Meetings, dates discussed:    Additional Comments:  Carles Collet, RN 05/24/2016, 10:32 AM

## 2016-05-24 NOTE — Discharge Summary (Signed)
Physician Discharge Summary  Richard Jacobs [id number] DOB: 10/12/1928 DOA: 05/21/2016  PCP: Richard Lyons, MD  Admit date: 05/21/2016 Discharge date: 05/24/2016  Time spent: 35 minutes  Recommendations for Outpatient Follow-up:  1. Repeat BMET to follow electrolytes and renal function 2. Repeat CXR in 3-4 weeks to ensure resolution of infiltrates.  Discharge Diagnoses:  Sepsis ([place]) CAP (community acquired pneumonia) Malignant neoplasm of prostate ([place]) CVA (cerebral infarction) Hypothyroidism Rhabdomyolysis Acute renal failure superimposed on stage 3 chronic kidney disease [name] [place]) Physical deconditioning/ Fall     Discharge Condition: stable and improved. Discharge home with [facility] services and instructions to follow up with PCP.  Diet recommendation: regular diet, with instructions to watch sodium intake   Filed Weights   05/21/16 1709  Weight: 90.7 kg (200 lb)    History of present illness:  As per Dr. [name] H&P written on 05/21/16 82 y.o. male with medical history significant of stroke with mild left sided weakness, hypothyroidism, prostate cancer with bone metastasis (s/p of radical prostatectomy, radiation and chemotherapy), aortic stenosis, carotid artery stenosis, CKD-III, who presents with fall, AMS and fever. Patient has AMS and is unable to provide accurate medical history, therefore, most of the history is obtained by discussing the case with ED physician, per EMS report, and with the nursing staff. It seems that pt had fall when he went to bathroom at about 9:30 AM. He was on the floor for about 4 hours and could not stand up due to generalized weakness. Not sure if pt has prodromal symptoms. Pt is confused and disoriented. Per his wife, patient has difficulty swallowing which is new to pt. He has chronic left-sided weakness, which has not changed. No new slurry speech, hearing loss or vision changes. Patient does not have nausea, vomiting, diarrhea, abdominal pain.  He may have increased urinary frequency per his wife, but no dysuria or burning on urination. Patient does not have chest pain, cough and shortness of breath. He was found to have fever with temperature 103.1 in ED.  Hospital Course:  1.  Sepsis due to PNA -Present on admission, evidenced by metabolic encephalopathy, temperature 103.1, lactic acid of 2.63, with source of infection likely coming from pneumonia -Blood cultures w/o growth, sepsis features resolved and Lactic acid level WNL at discharge -transitioned to PO antibiotics to complete therapy at discharge -advise to maintain adequate hydration   2.  Community-acquired pneumonia -He presented with functional decline, failure to thrive, recurrent falls at home and mental status changes. Workup in the emergency room overnight included chest x-ray showing consolidation involving left midlung -Treated with broad-spectrum IV antibiotic therapy with Vancomycin and Zosyn, along with Azithromycin for atypical coverage. After 3 days of IV therapy and clinical improved; transition to PO regimen to complete antibiotic therapy. -transitioned to levaquin (per pharmacy dosing) at discharge to complete antibiotic therapy  -PRN combivent to assist with SOB and potential wheezing   3.  Functional decline/failure to thrive -This is likely precipitated by infectious process, as he presents with sepsis in context of pneumonia. -Had a fall at home, prior to admission -Seen by PT who recommended [facility] services at discharge -Family looking to provide care/needed assistance after discharge  4. Mild Rhabdomyolysis. -Likely secondary to recent fall and muscle trauma -CK levels improved with IVF's -advise to maintain good hydration   5.  Hypomagnesemia and hypokalemia -repleted and WNL at discharge  6.  Hypothyroidism -Continue Synthroid 112 g by mouth daily  7.  Acute on chronic renal failure: stage  3 at baseline  -acute injury due to dehydration  and mild rhabdomyolysis  -improved and back to baseline with IVF's resuscitation  -advise to maintain adequate hydration   8.   Hx of CVA in the past -continue plavix for secondary prevention -continue risk factors modifications -No new deficit   Procedures:  See below for x-ray reports   Consultations:  None   Discharge Exam: Vitals:   05/24/16 0628 05/24/16 1400  BP: 137/76 (!) 118/56  Pulse: 98 97  Resp: 18 19  Temp: 99.3 F (37.4 C) 99.3 F (37.4 C)    General exam: Chronically ill-appearing, in no acute distress, afebrile and denies CP. Patient breathing better and looking to go home. Respiratory system: scattered rhonchi, breathing comfortably on room air now; no use of accessory muscles and no wheezing. Cardiovascular system: S1 & S2 heard, RRR. No JVD, murmurs, rubs, gallops or clicks. No pedal edema. Gastrointestinal system: Abdomen is nondistended, soft and nontender. No organomegaly or masses felt. Normal bowel sounds heard. Central nervous system: Alert and oriented. No focal neurological deficits. Extremities: Symmetric 5 x 5 power. Skin: No rashes, lesions or ulcers Psychiatry: Judgement and insight appear normal. Mood & affect appropriate.    Discharge Instructions   Discharge Instructions    Discharge instructions    Complete by:  As directed   Arrange follow up with PCP in 10 days Take medications as prescribed Please keep yourself well hydrated  Follow instructions from home health services.     Current Discharge Medication List    START taking these medications   Details  acetaminophen (TYLENOL) 325 MG tablet Take 2 tablets (650 mg total) by mouth every 6 (six) hours as needed for mild pain, fever or headache (or Fever >/= 101). Qty: 40 tablet, Refills: 0    Ipratropium-Albuterol (COMBIVENT RESPIMAT) 20-100 MCG/ACT AERS respimat Inhale 1 puff into the lungs every 6 (six) hours as needed for shortness of breath. Qty: 1 Inhaler, Refills: 0     levofloxacin (LEVAQUIN) 750 MG tablet Take 1 tablet (750 mg total) by mouth every other day. Qty: 4 tablet, Refills: 0      CONTINUE these medications which have NOT CHANGED   Details  Cholecalciferol (VITAMIN D3 PO) Take 1 tablet by mouth daily.    clopidogrel (PLAVIX) 75 MG tablet TAKE 1 TABLET (75 MG TOTAL) BY MOUTH DAILY. Qty: 30 tablet, Refills: 0    enzalutamide (XTANDI) 40 MG capsule Take 160 mg by mouth at bedtime.    levothyroxine (SYNTHROID, LEVOTHROID) 112 MCG tablet Take 112 mcg by mouth daily.       No Known Allergies Follow-up Information    [facility] .   Why:  For home health RN PT OT. They will call in the next 1-2 days to set up your first home visit. Contact information: [address] [place] [address] [phone]        ARONSON,RICHARD A, MD. Schedule an appointment as soon as possible for a visit in 10 day(s).   Specialty:  Internal Medicine Contact information: [place] [address] [phone]           The results of significant diagnostics from this hospitalization (including imaging, microbiology, ancillary and laboratory) are listed below for reference.    Significant Diagnostic Studies: Dg Chest 2 View  Result Date: 05/23/2016 CLINICAL DATA:  Pneumonia EXAM: CHEST  2 VIEW COMPARISON:  May 21, 2016 FINDINGS: There has been interval partial clearing of infiltrate from  the left mid lung. A small amount of patchy opacity does remain in this area. There is also atelectatic change in the left base. There is minimal atelectasis in the lateral right base. Lungs elsewhere clear. Heart is mildly enlarged with pulmonary vascularity within normal limits. There is atherosclerotic calcification in the aorta. No adenopathy is evident. IMPRESSION: Interval partial clearing of patchy infiltrate left mid lung. Mild bibasilar atelectasis. Stable cardiac silhouette. There is aortic atherosclerosis.  Electronically Signed   By: Lowella Grip III M.D.   On: 05/23/2016 07:20  Ct Head Wo Contrast  Result Date: 05/21/2016 CLINICAL DATA:  Post fall, now with confusion. History of prior right MCA distribution infarct. EXAM: CT HEAD WITHOUT CONTRAST TECHNIQUE: Contiguous axial images were obtained from the base of the skull through the vertex without intravenous contrast. COMPARISON:  10/02/2013; brain MRI - 10/02/2013 FINDINGS: Brain: Atrophy with sulcal prominence and centralized volume loss with commensurate expected dilatation of the ventricular system, progressed since the 09/2013 examination. Rather extensive periventricular hypodensities compatible microvascular ischemic disease, most conspicuous within the right centrum semiovale (image 23, series 201), also progressed since the 10/02/2013 examination. Given extensive background parenchymal abnormalities, there is no CT evidence superimposed acute large territory infarct. No intraparenchymal or extra-axial mass or hemorrhage. Normal configuration of the ventricles and basilar cisterns. No midline shift. Vascular: Intracranial atherosclerosis Skull: No displaced calvarial fracture. Sinuses/Orbits: There is complete opacification of the right frontal and anterior ethmoidal air cells with polypoid mucosal thickening involving the imaged portions of the right maxillary sinus. The remaining paranasal sinuses and mastoid air cells are normally aerated. No air-fluid levels. Other: Regional soft tissues appear normal. IMPRESSION: 1. Progressive atrophy and microvascular ischemic disease without acute intracranial process. 2. Sinus disease as above.  No air-fluid levels. Electronically Signed   By: Sandi Mariscal M.D.   On: 05/21/2016 19:49  Dg Chest Port 1 View  Result Date: 05/21/2016 CLINICAL DATA:  82 year old male fell to floor from standing position. Generalize weakness. Fever. Initial encounter. EXAM: PORTABLE CHEST 1 VIEW COMPARISON:  10/02/2013 chest  x-ray. FINDINGS: Probable skin folds bilaterally rather than pneumothorax. No obvious rib fracture. Consolidation projects over the lateral aspect of the left mid lung where overlying lead is noted. Follow-up two-view chest or single chest with leads removed recommended for further delineation. Calcified tortuous aorta. Cardiomegaly. Mild elevation left hemidiaphragm. Mild bilateral shoulder joint degenerative changes. IMPRESSION: Consolidation projects over the lateral aspect of the left mid lung where overlying lead is noted. Follow-up two-view chest or single chest with leads removed recommended for further delineation. Calcified tortuous aorta.  Aortic atherosclerosis. Cardiomegaly. Probable skin folds bilaterally rather than pneumothorax. No obvious rib fracture. Electronically Signed   By: Genia Del M.D.   On: 05/21/2016 18:18   Microbiology: Recent Results (from the past 240 hour(s))  Blood culture (routine x 2)     Status: None (Preliminary result)   Collection Time: 05/21/16  6:10 PM  Result Value Ref Range Status   Specimen Description BLOOD RIGHT ANTECUBITAL  Final   Special Requests IN PEDIATRIC BOTTLE 4CC  Final   Culture NO GROWTH 3 DAYS  Final   Report Status PENDING  Incomplete  Blood culture (routine x 2)     Status: None (Preliminary result)   Collection Time: 05/21/16  6:52 PM  Result Value Ref Range Status   Specimen Description BLOOD LEFT ARM  Final   Special Requests   Final    BOTTLES DRAWN AEROBIC AND ANAEROBIC 10CC AER,5CC ANA  Culture NO GROWTH 3 DAYS  Final   Report Status PENDING  Incomplete     Labs: Basic Metabolic Panel:  Recent Labs Lab 05/21/16 1810 05/22/16 0111 05/23/16 0655 05/24/16 0458  NA 139 139 141 140  K 3.9 3.7 3.4* 4.2  CL 106 109 108 110  CO2 25 23 23 23   GLUCOSE 143* 152* 117* 103*  BUN 17 17 15 14   CREATININE 1.45* 1.42* 1.42* 1.27*  CALCIUM 9.1 8.4* 8.4* 8.4*  MG  --  1.6*  --  2.0   Liver Function Tests:  Recent  Labs Lab 05/21/16 1810  AST 93*  ALT 27  ALKPHOS 91  BILITOT 1.4*  PROT 6.0*  ALBUMIN 3.1*    Recent Labs Lab 05/21/16 1910  AMMONIA 17   CBC:  Recent Labs Lab 05/21/16 1810 05/22/16 0111 05/23/16 0655 05/24/16 0458  WBC 8.8 8.1 7.6 7.2  NEUTROABS 7.0  --   --   --   HGB 12.0* 11.1* 10.0* 10.2*  HCT 36.3* 34.2* 30.8* 31.0*  MCV 90.8 90.7 91.4 89.9  PLT 161 146* 147* 159   Cardiac Enzymes:  Recent Labs Lab 05/21/16 1810 05/22/16 0111 05/23/16 0655  CKTOTAL 3,513* 3,735* 1,867*   BNP (last 3 results)  Recent Labs  05/22/16 0111  BNP 408.9*    CBG:  Recent Labs Lab 05/22/16 0842 05/24/16 0747  GLUCAP 137* 93    Signed:  Barton Dubois MD.  [facility] 05/24/2016, 4:54 PM

## 2016-05-24 NOTE — Progress Notes (Signed)
Physical Therapy Treatment Patient Details Name: Richard Jacobs MRN: [id number] DOB: 1928/01/06 Today's Date: 05/24/2016    History of Present Illness Pt is an 82 y/o male w/ h/o prostate cancer w/ bone mets, prior R MCA w/ L sided weakness, hypothyroidism, please see chart for entire PMH. Pt was admitted to hospital after fall at home and dx: Sepsis.     PT Comments    Patient currently min guard/min A and required cues to stay task focused. Wife present for session. Attempt stairs next session.   Follow Up Recommendations  Home health PT     Equipment Recommendations  None recommended by PT    Recommendations for Other Services       Precautions / Restrictions Precautions Precautions: Fall;Other (comment) Precaution Comments: Cardiac Restrictions Weight Bearing Restrictions: No    Mobility  Bed Mobility Overal bed mobility: Needs Assistance Bed Mobility: Supine to Sit     Supine to sit: Min assist;HOB elevated     General bed mobility comments: assist to elevate trunk into sitting and increased time with use of rails  Transfers Overall transfer level: Needs assistance Equipment used: Rolling walker (2 wheeled) Transfers: Sit to/from Stand Sit to Stand: Min assist;+2 safety/equipment         General transfer comment: from EOB and commode; cues for hand placement and safe use of AD; assist to power up into standing   Ambulation/Gait Ambulation/Gait assistance: Min assist Ambulation Distance (Feet): 100 Feet Assistive device: Rolling walker (2 wheeled) Gait Pattern/deviations: Step-through pattern;Decreased stride length;Trunk flexed Gait velocity: slower   General Gait Details: frequent cues for proximity of RW and increased bilat step length   Stairs            Wheelchair Mobility    Modified Rankin (Stroke Patients Only)       Balance     Sitting balance-Leahy Scale: Fair       Standing balance-Leahy Scale: Poor                       Cognition Arousal/Alertness: Awake/alert Behavior During Therapy: WFL for tasks assessed/performed Overall Cognitive Status: Impaired/Different from baseline Area of Impairment: Memory (Pt spouse corrects pt at times during assessment and reports AMS)               General Comments: pt needed cues to stay task focused     Exercises      General Comments General comments (skin integrity, edema, etc.): pt incontinent of urine ambulating to bathroom; wife present for session      Pertinent Vitals/Pain Pain Assessment: No/denies pain    Home Living                      Prior Function            PT Goals (current goals can now be found in the care plan section) Acute Rehab PT Goals Patient Stated Goal: get better PT Goal Formulation: With patient Time For Goal Achievement: 05/29/16 Potential to Achieve Goals: Good Progress towards PT goals: Progressing toward goals    Frequency  Min 3X/week    PT Plan Current plan remains appropriate    Co-evaluation             End of Session Equipment Utilized During Treatment: Gait belt Activity Tolerance: Patient tolerated treatment well Patient left: in chair;with call bell/phone within reach;with chair alarm set;with family/visitor present     Time: [id number] PT Time  Calculation (min) (ACUTE ONLY): 32 min  Charges:  $Gait Training: 8-22 mins $Therapeutic Activity: 8-22 mins                       Salina April, PTA Pager: [phone]   05/24/2016, 11:28 AM

## 2016-05-26 LAB — CULTURE, BLOOD (ROUTINE X 2)
Culture: NO GROWTH
Culture: NO GROWTH

## 2016-05-29 ENCOUNTER — Emergency Department (HOSPITAL_COMMUNITY): Payer: Medicare Other

## 2016-05-29 ENCOUNTER — Emergency Department (HOSPITAL_COMMUNITY)
Admission: EM | Admit: 2016-05-29 | Discharge: 2016-05-29 | Disposition: A | Discharge status: Home / Self Care | Payer: Medicare Other | Attending: Emergency Medicine | Admitting: Emergency Medicine

## 2016-05-29 ENCOUNTER — Encounter (HOSPITAL_COMMUNITY): Payer: Self-pay

## 2016-05-29 DIAGNOSIS — I2510 Atherosclerotic heart disease of native coronary artery without angina pectoris: Secondary | ICD-10-CM | POA: Diagnosis not present

## 2016-05-29 DIAGNOSIS — N183 Chronic kidney disease, stage 3 (moderate): Secondary | ICD-10-CM | POA: Diagnosis not present

## 2016-05-29 DIAGNOSIS — Z8546 Personal history of malignant neoplasm of prostate: Secondary | ICD-10-CM | POA: Diagnosis not present

## 2016-05-29 DIAGNOSIS — Z7902 Long term (current) use of antithrombotics/antiplatelets: Secondary | ICD-10-CM | POA: Diagnosis not present

## 2016-05-29 DIAGNOSIS — R7989 Other specified abnormal findings of blood chemistry: Secondary | ICD-10-CM

## 2016-05-29 DIAGNOSIS — Z8673 Personal history of transient ischemic attack (TIA), and cerebral infarction without residual deficits: Secondary | ICD-10-CM | POA: Diagnosis not present

## 2016-05-29 DIAGNOSIS — R791 Abnormal coagulation profile: Secondary | ICD-10-CM | POA: Diagnosis not present

## 2016-05-29 DIAGNOSIS — E039 Hypothyroidism, unspecified: Secondary | ICD-10-CM | POA: Diagnosis not present

## 2016-05-29 DIAGNOSIS — R600 Localized edema: Secondary | ICD-10-CM

## 2016-05-29 DIAGNOSIS — M7989 Other specified soft tissue disorders: Secondary | ICD-10-CM | POA: Diagnosis present

## 2016-05-29 LAB — CBC WITH DIFFERENTIAL/PLATELET
Basophils Absolute: 0 K/uL (ref 0.0–0.1)
Basophils Relative: 1 %
Eosinophils Absolute: 0.2 K/uL (ref 0.0–0.7)
Eosinophils Relative: 4 %
HCT: 30.8 % — ABNORMAL LOW (ref 39.0–52.0)
Hemoglobin: 10 g/dL — ABNORMAL LOW (ref 13.0–17.0)
Lymphocytes Relative: 20 %
Lymphs Abs: 1.4 K/uL (ref 0.7–4.0)
MCH: 29.3 pg (ref 26.0–34.0)
MCHC: 32.5 g/dL (ref 30.0–36.0)
MCV: 90.3 fL (ref 78.0–100.0)
Monocytes Absolute: 0.8 K/uL (ref 0.1–1.0)
Monocytes Relative: 11 %
Neutro Abs: 4.5 K/uL (ref 1.7–7.7)
Neutrophils Relative %: 64 %
Platelets: 279 K/uL (ref 150–400)
RBC: 3.41 MIL/uL — ABNORMAL LOW (ref 4.22–5.81)
RDW: 13.6 % (ref 11.5–15.5)
WBC: 6.9 K/uL (ref 4.0–10.5)

## 2016-05-29 LAB — BASIC METABOLIC PANEL WITH GFR
Anion gap: 5 (ref 5–15)
BUN: 9 mg/dL (ref 6–20)
CO2: 24 mmol/L (ref 22–32)
Calcium: 8.8 mg/dL — ABNORMAL LOW (ref 8.9–10.3)
Chloride: 108 mmol/L (ref 101–111)
Creatinine, Ser: 1.24 mg/dL (ref 0.61–1.24)
GFR calc Af Amer: 59 mL/min — ABNORMAL LOW
GFR calc non Af Amer: 51 mL/min — ABNORMAL LOW
Glucose, Bld: 108 mg/dL — ABNORMAL HIGH (ref 65–99)
Potassium: 3.8 mmol/L (ref 3.5–5.1)
Sodium: 137 mmol/L (ref 135–145)

## 2016-05-29 LAB — URINE MICROSCOPIC-ADD ON

## 2016-05-29 LAB — URINALYSIS, ROUTINE W REFLEX MICROSCOPIC
Bilirubin Urine: NEGATIVE
Glucose, UA: NEGATIVE mg/dL
Ketones, ur: NEGATIVE mg/dL
Leukocytes, UA: NEGATIVE
Nitrite: NEGATIVE
Protein, ur: NEGATIVE mg/dL
Specific Gravity, Urine: 1.02 (ref 1.005–1.030)
pH: 5 (ref 5.0–8.0)

## 2016-05-29 LAB — BRAIN NATRIURETIC PEPTIDE: B Natriuretic Peptide: 340.4 pg/mL — ABNORMAL HIGH (ref 0.0–100.0)

## 2016-05-29 LAB — D-DIMER, QUANTITATIVE: D-Dimer, Quant: 2.09 ug{FEU}/mL — ABNORMAL HIGH (ref 0.00–0.50)

## 2016-05-29 MED ORDER — ENOXAPARIN SODIUM 100 MG/ML ~~LOC~~ SOLN
1.0000 mg/kg | Freq: Two times a day (BID) | SUBCUTANEOUS | Status: DC
  Administered 2016-05-29: 90 mg via SUBCUTANEOUS
  Filled 2016-05-29: qty 1

## 2016-05-29 MED ORDER — IOPAMIDOL (ISOVUE-370) INJECTION 76%
INTRAVENOUS | Status: AC
  Administered 2016-05-29: 90 mL
  Filled 2016-05-29: qty 100
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
## 2016-05-29 NOTE — ED Triage Notes (Addendum)
Per EMS, pt from home with complaint of leg swelling that began today. Pt was admitted here last week for 3 days due to fall and pneumonia. Pt states that he noticed bilateral leg swelling this afternoon. EMS VS 134/68, HR 84, RR 14, 98% on RA.Pt is on plavix for previous cva. Pt endorses that leg swelling has made it harder to ambulate today. Pt denies shortness of breath.
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
## 2016-05-29 NOTE — ED Notes (Signed)
Samantha, PA at bedside  °

## 2016-05-29 NOTE — ED Notes (Signed)
Pt attempted to urinate but was unable to provide sample. PA notified.

## 2016-05-29 NOTE — ED Notes (Signed)
Pt and family verbalized understanding to come back to the admitting dept in the morning for venous study. Pt stable and NAD. Respirations unlabored, airway intact. Pt alert and oriented x 4. NAD.

## 2016-05-29 NOTE — Discharge Instructions (Signed)
Your CT did not show any blood clot in your lungs today. However, we are still concerned about a possible blood clot in your leg. Please come back tomorrow to get ultrasound done to rule this out. Please also follow up with your primary care provider for further evaluation of your lower extremity swelling. Return to the ED if you experience severe worsening of your symptoms, shortness of breath, chest pain.

## 2016-05-30 ENCOUNTER — Other Ambulatory Visit: Payer: Self-pay

## 2016-05-30 ENCOUNTER — Encounter (HOSPITAL_COMMUNITY): Payer: Self-pay | Admitting: Neurology

## 2016-05-30 ENCOUNTER — Ambulatory Visit (HOSPITAL_BASED_OUTPATIENT_CLINIC_OR_DEPARTMENT_OTHER)
Admission: RE | Admit: 2016-05-30 | Discharge: 2016-05-30 | Disposition: A | Discharge status: Home / Self Care | Payer: Medicare Other | Source: Ambulatory Visit | Attending: Emergency Medicine | Admitting: Emergency Medicine

## 2016-05-30 ENCOUNTER — Inpatient Hospital Stay (HOSPITAL_COMMUNITY)
Admission: EM | Admit: 2016-05-30 | Discharge: 2016-06-02 | DRG: 299 | Disposition: A | Discharge status: Skilled Nursing Facility | Payer: Medicare Other | Attending: Internal Medicine | Admitting: Internal Medicine

## 2016-05-30 DIAGNOSIS — C7951 Secondary malignant neoplasm of bone: Secondary | ICD-10-CM

## 2016-05-30 DIAGNOSIS — Z8673 Personal history of transient ischemic attack (TIA), and cerebral infarction without residual deficits: Secondary | ICD-10-CM | POA: Diagnosis not present

## 2016-05-30 DIAGNOSIS — Z720 Tobacco use: Secondary | ICD-10-CM | POA: Diagnosis not present

## 2016-05-30 DIAGNOSIS — Z9079 Acquired absence of other genital organ(s): Secondary | ICD-10-CM | POA: Diagnosis not present

## 2016-05-30 DIAGNOSIS — Z8546 Personal history of malignant neoplasm of prostate: Secondary | ICD-10-CM | POA: Diagnosis not present

## 2016-05-30 DIAGNOSIS — I82409 Acute embolism and thrombosis of unspecified deep veins of unspecified lower extremity: Secondary | ICD-10-CM | POA: Diagnosis present

## 2016-05-30 DIAGNOSIS — N183 Chronic kidney disease, stage 3 (moderate): Secondary | ICD-10-CM | POA: Diagnosis not present

## 2016-05-30 DIAGNOSIS — J189 Pneumonia, unspecified organism: Secondary | ICD-10-CM | POA: Diagnosis not present

## 2016-05-30 DIAGNOSIS — R296 Repeated falls: Secondary | ICD-10-CM | POA: Diagnosis not present

## 2016-05-30 DIAGNOSIS — D6489 Other specified anemias: Secondary | ICD-10-CM | POA: Diagnosis not present

## 2016-05-30 DIAGNOSIS — I82402 Acute embolism and thrombosis of unspecified deep veins of left lower extremity: Secondary | ICD-10-CM | POA: Diagnosis not present

## 2016-05-30 DIAGNOSIS — Z7902 Long term (current) use of antithrombotics/antiplatelets: Secondary | ICD-10-CM

## 2016-05-30 DIAGNOSIS — E039 Hypothyroidism, unspecified: Secondary | ICD-10-CM | POA: Diagnosis present

## 2016-05-30 DIAGNOSIS — I350 Nonrheumatic aortic (valve) stenosis: Secondary | ICD-10-CM | POA: Diagnosis not present

## 2016-05-30 DIAGNOSIS — R609 Edema, unspecified: Secondary | ICD-10-CM

## 2016-05-30 DIAGNOSIS — Z79899 Other long term (current) drug therapy: Secondary | ICD-10-CM

## 2016-05-30 DIAGNOSIS — C61 Malignant neoplasm of prostate: Secondary | ICD-10-CM

## 2016-05-30 DIAGNOSIS — Z800 Family history of malignant neoplasm of digestive organs: Secondary | ICD-10-CM

## 2016-05-30 DIAGNOSIS — Z9181 History of falling: Secondary | ICD-10-CM | POA: Diagnosis not present

## 2016-05-30 DIAGNOSIS — R531 Weakness: Secondary | ICD-10-CM

## 2016-05-30 HISTORY — DX: Pneumonia, unspecified organism: J18.9

## 2016-05-30 HISTORY — DX: Sepsis, unspecified organism: A41.9

## 2016-05-30 HISTORY — DX: Acute embolism and thrombosis of unspecified deep veins of unspecified lower extremity: I82.409

## 2016-05-30 MED ORDER — IPRATROPIUM-ALBUTEROL 0.5-2.5 (3) MG/3ML IN SOLN: 3.0000 mL | Freq: Four times a day (QID) | RESPIRATORY_TRACT | Status: DC | PRN

## 2016-05-30 MED ORDER — ENZALUTAMIDE 40 MG PO CAPS
160.0000 mg | ORAL_CAPSULE | Freq: Every day | ORAL | Status: DC
  Administered 2016-05-31 – 2016-06-01 (×2): 160 mg via ORAL
  Filled 2016-05-30: qty 4

## 2016-05-30 MED ORDER — ACETAMINOPHEN 325 MG PO TABS
650.0000 mg | ORAL_TABLET | Freq: Four times a day (QID) | ORAL | Status: DC | PRN
  Administered 2016-05-31: 650 mg via ORAL
  Filled 2016-05-30 (×2): qty 2

## 2016-05-30 MED ORDER — SODIUM CHLORIDE 0.9 % IV SOLN: 250.0000 mL | INTRAVENOUS | Status: DC | PRN

## 2016-05-30 MED ORDER — ONDANSETRON HCL 4 MG/2ML IJ SOLN: 4.0000 mg | Freq: Four times a day (QID) | INTRAMUSCULAR | Status: DC | PRN

## 2016-05-30 MED ORDER — ONDANSETRON HCL 4 MG PO TABS: 4.0000 mg | ORAL_TABLET | Freq: Four times a day (QID) | ORAL | Status: DC | PRN

## 2016-05-30 MED ORDER — LEVOTHYROXINE SODIUM 112 MCG PO TABS
112.0000 ug | ORAL_TABLET | Freq: Every day | ORAL | Status: DC
  Administered 2016-05-31 – 2016-06-02 (×3): 112 ug via ORAL
  Filled 2016-05-30 (×3): qty 1

## 2016-05-30 MED ORDER — SODIUM CHLORIDE 0.9% FLUSH: 3.0000 mL | INTRAVENOUS | Status: DC | PRN

## 2016-05-30 MED ORDER — IPRATROPIUM-ALBUTEROL 20-100 MCG/ACT IN AERS: 1.0000 | INHALATION_SPRAY | Freq: Four times a day (QID) | RESPIRATORY_TRACT | Status: DC | PRN

## 2016-05-30 MED ORDER — LEVOFLOXACIN 750 MG PO TABS
750.0000 mg | ORAL_TABLET | ORAL | Status: DC
  Administered 2016-05-31 – 2016-06-01 (×2): 750 mg via ORAL
  Filled 2016-05-30 (×2): qty 1

## 2016-05-30 MED ORDER — ENOXAPARIN SODIUM 100 MG/ML ~~LOC~~ SOLN
1.0000 mg/kg | Freq: Two times a day (BID) | SUBCUTANEOUS | Status: DC
  Administered 2016-05-30 – 2016-06-02 (×6): 95 mg via SUBCUTANEOUS
  Filled 2016-05-30 (×7): qty 1

## 2016-05-30 MED ORDER — SODIUM CHLORIDE 0.9% FLUSH
3.0000 mL | Freq: Two times a day (BID) | INTRAVENOUS | Status: DC
Start: 2016-05-30 — End: 2016-06-02
  Administered 2016-05-31 – 2016-06-02 (×5): 3 mL via INTRAVENOUS

## 2016-05-30 NOTE — Care Management Note (Signed)
Case Management Note  Patient Details  Name: Richard Jacobs MRN: [id number] Date of Birth: Jun 14, 1928  Subjective/Objective:                    Action/Plan:   Expected Discharge Date:                  Expected Discharge Plan:  [name]  In-House Referral:  Clinical Social Work  Discharge planning Services  CM Consult  Post Acute Care Choice:    Choice offered to:     DME Arranged:    DME Agency:     HH Arranged:    [place] Agency:  [name]  Status of Service:  In process, will continue to follow  If discussed at Long Length of Stay Meetings, dates discussed:    Additional Comments:  Fuller Mandril, RN 05/30/2016, 2:00 PM

## 2016-05-30 NOTE — ED Triage Notes (Signed)
Pt here with dx left DVT from [place] that was done today. Pt c/o swelling to BLE and pain. Was admitted recently for pneumonia. Pt is alert, here with family. Was at [facility] ED last night.
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
## 2016-05-30 NOTE — ED Notes (Signed)
Wheeled pt back to room from waiting room. 

## 2016-05-30 NOTE — ED Notes (Signed)
Spoke with Dr. Maryan Rued about patient, no labs needed at this time. Pt denies CP.

## 2016-05-30 NOTE — Progress Notes (Signed)
Admitted to 6N27 from ED, patient  Is alert and oriented, not in any distress, VSS. Oriented to unit and staff. MD made aware of the admission,. Will wndorse appropriately.
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
## 2016-05-30 NOTE — Progress Notes (Addendum)
*  PRELIMINARY RESULTS* Vascular Ultrasound Lower extremity venous duplex has been completed.  Preliminary findings: DVT noted in the left peroneal veins. Superficial thrombosis of the left small saphenous vein. No DVT RLE.   9:20 AM Advised patient and family to return to ED for treatment.    Landry Mellow, RDMS, RVT  05/30/2016, 9:20 AM

## 2016-05-30 NOTE — ED Provider Notes (Signed)
Patient discussed with Dr. Maryan Rued. Patient seen by tried hospitalist. Intervention obtained with social work and Transport planner. They agree the patient would qualify for rehabilitation bed. However we'll have to undergo rehabilitation evaluation with PT and OT which could not be completed today. Observation bed has been requested. Care discussed with [facility] hospitalist. Patient be admitted from bed continued seeking of placement at appropriate facility tomorrow, after PT and OT formal recommendations.   Tanna Furry, MD 05/30/16 1719

## 2016-05-31 DIAGNOSIS — I82402 Acute embolism and thrombosis of unspecified deep veins of left lower extremity: Secondary | ICD-10-CM | POA: Diagnosis not present

## 2016-05-31 DIAGNOSIS — R531 Weakness: Secondary | ICD-10-CM | POA: Diagnosis not present

## 2016-05-31 LAB — CBC
HEMATOCRIT: 29.4 % — AB (ref 39.0–52.0)
HEMOGLOBIN: 9.7 g/dL — AB (ref 13.0–17.0)
MCH: 29.8 pg (ref 26.0–34.0)
MCHC: 33 g/dL (ref 30.0–36.0)
MCV: 90.2 fL (ref 78.0–100.0)
Platelets: 300 10*3/uL (ref 150–400)
RBC: 3.26 MIL/uL — AB (ref 4.22–5.81)
RDW: 13.4 % (ref 11.5–15.5)
WBC: 5.6 10*3/uL (ref 4.0–10.5)

## 2016-05-31 LAB — COMPREHENSIVE METABOLIC PANEL
ALBUMIN: 2.3 g/dL — AB (ref 3.5–5.0)
ALT: 21 U/L (ref 17–63)
ANION GAP: 5 (ref 5–15)
AST: 23 U/L (ref 15–41)
Alkaline Phosphatase: 75 U/L (ref 38–126)
BILIRUBIN TOTAL: 0.3 mg/dL (ref 0.3–1.2)
BUN: 6 mg/dL (ref 6–20)
CHLORIDE: 110 mmol/L (ref 101–111)
CO2: 24 mmol/L (ref 22–32)
Calcium: 8.7 mg/dL — ABNORMAL LOW (ref 8.9–10.3)
Creatinine, Ser: 1.31 mg/dL — ABNORMAL HIGH (ref 0.61–1.24)
GFR calc Af Amer: 55 mL/min — ABNORMAL LOW (ref >60)
GFR, EST NON AFRICAN AMERICAN: 48 mL/min — AB (ref >60)
Glucose, Bld: 107 mg/dL — ABNORMAL HIGH (ref 65–99)
POTASSIUM: 3.9 mmol/L (ref 3.5–5.1)
Sodium: 139 mmol/L (ref 135–145)
TOTAL PROTEIN: 5.3 g/dL — AB (ref 6.5–8.1)

## 2016-05-31 LAB — PROTIME-INR
INR: 1.23
Prothrombin Time: 15.6 seconds — ABNORMAL HIGH (ref 11.4–15.2)

## 2016-05-31 MED ORDER — PATIENT'S GUIDE TO USING COUMADIN BOOK
Freq: Once | Status: AC
  Administered 2016-05-31
  Filled 2016-05-31: qty 1

## 2016-05-31 MED ORDER — WARFARIN SODIUM 5 MG PO TABS
5.0000 mg | ORAL_TABLET | Freq: Once | ORAL | Status: AC
  Administered 2016-05-31: 5 mg via ORAL
  Filled 2016-05-31: qty 1

## 2016-05-31 MED ORDER — SODIUM CHLORIDE 0.9 % IV BOLUS (SEPSIS)
500.0000 mL | Freq: Once | INTRAVENOUS | Status: AC
  Administered 2016-05-31: 500 mL via INTRAVENOUS

## 2016-05-31 MED ORDER — WARFARIN - PHARMACIST DOSING INPATIENT: Freq: Every day | Status: DC
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
## 2016-05-31 NOTE — Care Management Obs Status (Signed)
[place] NOTIFICATION   Patient Details  Name: Richard Jacobs MRN: [id number] Date of Birth: 10-Dec-1927   Medicare Observation Status Notification Given:       Maryclare Labrador, RN 05/31/2016, 4:28 PM

## 2016-05-31 NOTE — Progress Notes (Addendum)
PROGRESS NOTE    Richard Jacobs  [id number] DOB: Jan 07, 1928 DOA: 05/30/2016 PCP: Geoffery Lyons, MD    Brief Narrative: Richard Jacobs is a 82 y.o. male with medical history significant of ambulatory function and the recent fall. About 3 months ago patient has been developing worsening generalized weakness, and had another fall about a week ago, presents with left lower extremity edema, was found to have DVT in the left lower extremity.    Assessment & Plan:   Active Problems:   DVT (deep vein thrombosis) in pregnancy   DVT (deep venous thrombosis), left   DVT of the left lower extremity: Started him on lovenox and coumadin.  Will probably need lifelong anticoagulation.  CT chest negative for PE.    RESOLVING pneumonia: Complete the course of the antibiotics. Afebrile and normal wbc count.   Multiple falls: Discussed with family regarding multiple falls and relative contraindication to anticoagulation.  PT evaluation ordered.   Stage 3 CKD: Stable   Hypothyroidism; Resume synthroid.   H/o Prostate cancer: Resume enzalutamide.   Normocytic anemia: Monitor.     DVT prophylaxis: (lovenox and coumadin Code Status: (Full) Family Communication: son and wife at bedside.  Disposition Plan: pending further evaluation.    Consultants:   none   Procedures: venous duplex.   Antimicrobials:  One dose of levaquin.   Subjective: No new complaints.   Objective: Vitals:   05/30/16 1814 05/30/16 2220 05/31/16 0542 05/31/16 1500  BP: 106/69 (!) 130/97 116/64 122/70  Pulse: 86 83 80 77  Resp: 17 18 18 18   Temp: 98.7 F (37.1 C) (!) 100.7 F (38.2 C) 99.2 F (37.3 C) 98.9 F (37.2 C)  TempSrc: Oral Oral Oral Oral  SpO2: 98% 96% 97% 98%  Weight: 97.1 kg (214 lb 1.1 oz)     Height: 5\' 11"  (1.803 m)       Intake/Output Summary (Last 24 hours) at 05/31/16 1916 Last data filed at 05/31/16 1500  Gross per 24 hour  Intake              230 ml    Output                0 ml  Net              230 ml   Filed Weights   05/30/16 1814  Weight: 97.1 kg (214 lb 1.1 oz)    Examination:  General exam: Appears calm and comfortable  Respiratory system: Clear to auscultation. Respiratory effort normal. Cardiovascular system: S1 & S2 heard, RRR. No JVD,  Gastrointestinal system: Abdomen is nondistended, soft and nontender. No organomegaly or masses felt. Normal bowel sounds heard. Central nervous system: Alert and  Comfortable, confused.  Extremities: bilateral 2 + edema, left lower extremity  More swollen and warm when compared to right.  Skin: No rashes, lesions or ulcers     Data Reviewed: I have personally reviewed following labs and imaging studies  CBC:  Recent Labs Lab 05/29/16 1800 05/31/16 0215  WBC 6.9 5.6  NEUTROABS 4.5  --   HGB 10.0* 9.7*  HCT 30.8* 29.4*  MCV 90.3 90.2  PLT 279 [id number]   Basic Metabolic Panel:  Recent Labs Lab 05/29/16 1800 05/31/16 0215  NA 137 139  K 3.8 3.9  CL 108 110  CO2 24 24  GLUCOSE 108* 107*  BUN 9 6  CREATININE 1.24 1.31*  CALCIUM 8.8* 8.7*   GFR: Estimated Creatinine Clearance: 48.1 mL/min (by C-G  formula based on SCr of 1.31 mg/dL). Liver Function Tests:  Recent Labs Lab 05/31/16 0215  AST 23  ALT 21  ALKPHOS 75  BILITOT 0.3  PROT 5.3*  ALBUMIN 2.3*   No results for input(s): LIPASE, AMYLASE in the last 168 hours. No results for input(s): AMMONIA in the last 168 hours. Coagulation Profile:  Recent Labs Lab 05/31/16 0215  INR 1.23   Cardiac Enzymes: No results for input(s): CKTOTAL, CKMB, CKMBINDEX, TROPONINI in the last 168 hours. BNP (last 3 results) No results for input(s): PROBNP in the last 8760 hours. HbA1C: No results for input(s): HGBA1C in the last 72 hours. CBG: No results for input(s): GLUCAP in the last 168 hours. Lipid Profile: No results for input(s): CHOL, HDL, LDLCALC, TRIG, CHOLHDL, LDLDIRECT in the last 72 hours. Thyroid Function  Tests: No results for input(s): TSH, T4TOTAL, FREET4, T3FREE, THYROIDAB in the last 72 hours. Anemia Panel: No results for input(s): VITAMINB12, FOLATE, FERRITIN, TIBC, IRON, RETICCTPCT in the last 72 hours. Sepsis Labs: No results for input(s): PROCALCITON, LATICACIDVEN in the last 168 hours.  No results found for this or any previous visit (from the past 240 hour(s)).       Radiology Studies: Ct Angio Chest Pe W And/or Wo Contrast  Result Date: 05/29/2016 CLINICAL DATA:  Pneumonia, sepsis. History of prostate cancer. Bilateral lower extremity swelling with elevated D-dimer. EXAM: CT ANGIOGRAPHY CHEST WITH CONTRAST TECHNIQUE: Multidetector CT imaging of the chest was performed using the standard protocol during bolus administration of intravenous contrast. Multiplanar CT image reconstructions and MIPs were obtained to evaluate the vascular anatomy. CONTRAST:  79 mL Isovue 370 IV. COMPARISON:  Chest x-ray 05/29/2016 and CT abdomen 01/07/2006 FINDINGS: Examination demonstrates small bilateral pleural effusions with mild associated airspace density most typical for atelectasis and less likely infection. Mild dependent atelectasis adjacent the left major fissure and mild linear atelectasis over the left lower lobe. Airways are within normal. Mild cardiomegaly. Calcified plaque over the mitral valve annulus. Calcified plaque at the aortic valve. Calcified plaque over the left main and left anterior descending coronary arteries. Mild calcified plaque over the thoracic aorta. Pulmonary arterial system is well opacified without evidence of emboli. Mild motion artifact is present making interpretation of the lower lobar pulmonary artery somewhat difficult. Remaining mediastinal structures are within normal. There is no mediastinal or hilar adenopathy. Images through the upper abdomen demonstrate no definite focal abnormality. There are multiple sclerotic foci throughout the spine, sternum, ribs and scapula  compatible with diffuse bony metastatic disease. Review of the MIP images confirms the above findings. IMPRESSION: No evidence of pulmonary embolism. Small bilateral pleural effusions with mild associated basilar airspace density most typical for atelectasis and less likely infection. Diffuse sclerotic osseous metastatic disease likely from patient's known prostate cancer. Mild cardiomegaly. Aortic atherosclerosis. Mild atherosclerotic coronary artery disease. Calcification over the mitral valve and aortic valves. Electronically Signed   By: Marin Olp M.D.   On: 05/29/2016 20:52        Scheduled Meds: . enoxaparin (LOVENOX) injection  1 mg/kg Subcutaneous Q12H  . enzalutamide  160 mg Oral QHS  . levofloxacin  750 mg Oral Q48H  . levothyroxine  112 mcg Oral Daily  . sodium chloride flush  3 mL Intravenous Q12H   Continuous Infusions:    LOS: 1 day    Time spent: 30 minutes.     Hosie Poisson, MD [facility] Pager [phone]  If 7PM-7AM, please contact night-coverage [email or web address] Password [email or web address] 05/31/2016, 7:16 PM

## 2016-05-31 NOTE — Evaluation (Addendum)
Physical Therapy Evaluation Patient Details Name: Richard Jacobs MRN: [id number] DOB: Jul 25, 1928 Today's Date: 05/31/2016   History of Present Illness  Pt is an 82 y/o male admitted after a fall with pain in L LE and was later diagnosed with a DVT after admission. PMH including but not limited to prostate cancer with bone mets, prior R MCA with L sided weakness, CKD, and CAD.    Clinical Impression  Pt presented L sidelying in bed, awake and willing to participate in therapy session. Pt's cognition was difficult to assess as there was no family or caregiver present to establish a baseline. Pt seemed to have intermittent confusion throughout session. He also had some intermittent agitation. Pt was able to perform bed mobility with min A and SPT transfer to recliner with min A of two for safety. Pt would continue to benefit from skilled physical therapy services at this time while admitted and after d/c to address his limitations in order to improve his overall safety and independence with functional mobility.      Follow Up Recommendations SNF    Equipment Recommendations  None recommended by PT    Recommendations for Other Services       Precautions / Restrictions Precautions Precautions: Fall Restrictions Weight Bearing Restrictions: No      Mobility  Bed Mobility Overal bed mobility: Needs Assistance Bed Mobility: Supine to Sit     Supine to sit: Min assist;HOB elevated     General bed mobility comments: Pt required increased time to complete task and min A with bilateral LEs  Transfers Overall transfer level: Needs assistance Equipment used: Rolling walker (2 wheeled) Transfers: Stand Pivot Transfers   Stand pivot transfers: Min assist;+2 physical assistance;+2 safety/equipment;From elevated surface       General transfer comment: Pt required increased time and VC'ing for bilateral hand placement  Ambulation/Gait                Stairs             Wheelchair Mobility    Modified Rankin (Stroke Patients Only)       Balance Overall balance assessment: Needs assistance Sitting-balance support: Feet unsupported;Bilateral upper extremity supported Sitting balance-Leahy Scale: Poor     Standing balance support: During functional activity;Bilateral upper extremity supported Standing balance-Leahy Scale: Poor Standing balance comment: pt relied heavily on RW                             Pertinent Vitals/Pain Pain Assessment: No/denies pain    Home Living Family/patient expects to be discharged to:: Skilled nursing facility Living Arrangements: Spouse/significant other                    Prior Function Level of Independence: Needs assistance   Gait / Transfers Assistance Needed: Unsure as no caregiver or family was present and pt unable to specify his level of independence  ADL's / Homemaking Assistance Needed: Pt stated he required assist from his wife for dressing and bathing.        Hand Dominance        Extremity/Trunk Assessment   Upper Extremity Assessment: Overall WFL for tasks assessed           Lower Extremity Assessment: Generalized weakness         Communication   Communication: No difficulties  Cognition Arousal/Alertness: Awake/alert Behavior During Therapy: WFL for tasks assessed/performed Overall Cognitive Status: No family/caregiver present to  determine baseline cognitive functioning Area of Impairment: Memory;Following commands       Following Commands: Follows one step commands with increased time;Follows multi-step commands inconsistently       General Comments: Pt oriented to self and situation.    General Comments      Exercises        Assessment/Plan    PT Assessment Patient needs continued PT services  PT Diagnosis Generalized weakness;Abnormality of gait   PT Problem List Decreased strength;Decreased range of motion;Decreased activity  tolerance;Decreased balance;Decreased mobility;Decreased coordination;Decreased knowledge of use of DME;Pain  PT Treatment Interventions DME instruction;Gait training;Functional mobility training;Therapeutic activities;Therapeutic exercise;Patient/family education;Balance training   PT Goals (Current goals can be found in the Care Plan section) Acute Rehab PT Goals Patient Stated Goal: to feel better PT Goal Formulation: With patient Time For Goal Achievement: 06/07/16 Potential to Achieve Goals: Fair    Frequency Min 3X/week   Barriers to discharge        Co-evaluation               End of Session Equipment Utilized During Treatment: Gait belt Activity Tolerance: Patient limited by fatigue Patient left: in chair;with call bell/phone within reach Nurse Communication: Mobility status         Time: [id number] PT Time Calculation (min) (ACUTE ONLY): 20 min   Charges:   PT Evaluation $PT Eval Moderate Complexity: 1 Procedure     PT G Codes:    06/26/16 1500  PT G-Codes **NOT FOR INPATIENT CLASS**  Functional Assessment Tool Used clinical judgement  Functional Limitation Mobility: Walking and moving around  Mobility: Walking and Moving Around Current Status [id number]) CJ  Mobility: Walking and Moving Around Goal Status [id number]) [place] 2016-06-26, 4:11 PM Sherie Don, [place], DPT [phone]

## 2016-05-31 NOTE — Care Management Obs Status (Signed)
[place] NOTIFICATION   Patient Details  Name: Richard Jacobs MRN: [id number] Date of Birth: 12-18-1927   Medicare Observation Status Notification Given:       Maryclare Labrador, RN 05/31/2016, 4:28 PM

## 2016-05-31 NOTE — Care Management (Signed)
lovenox benefit check   LOVENOX 100 MG BID ( 30 )  COVER- YES  CO-PAY - $ 172.00  PRIOR APPROVAL  PHARM[facility]Y:[facility]   Discharge plan :  SNF - CM informed CSW of Lovenox and cost

## 2016-06-01 DIAGNOSIS — R531 Weakness: Secondary | ICD-10-CM | POA: Diagnosis not present

## 2016-06-01 DIAGNOSIS — I82402 Acute embolism and thrombosis of unspecified deep veins of left lower extremity: Secondary | ICD-10-CM | POA: Diagnosis not present

## 2016-06-01 LAB — BASIC METABOLIC PANEL
ANION GAP: 9 (ref 5–15)
BUN: 7 mg/dL (ref 6–20)
CALCIUM: 8.5 mg/dL — AB (ref 8.9–10.3)
CHLORIDE: 106 mmol/L (ref 101–111)
CO2: 23 mmol/L (ref 22–32)
CREATININE: 1.14 mg/dL (ref 0.61–1.24)
GFR calc non Af Amer: 56 mL/min — ABNORMAL LOW (ref >60)
Glucose, Bld: 90 mg/dL (ref 65–99)
Potassium: 3.2 mmol/L — ABNORMAL LOW (ref 3.5–5.1)
SODIUM: 138 mmol/L (ref 135–145)

## 2016-06-01 LAB — PROTIME-INR
INR: 1.16
Prothrombin Time: 14.8 seconds (ref 11.4–15.2)

## 2016-06-01 LAB — CBC
HCT: 28.6 % — ABNORMAL LOW (ref 39.0–52.0)
HEMOGLOBIN: 9.3 g/dL — AB (ref 13.0–17.0)
MCH: 29.4 pg (ref 26.0–34.0)
MCHC: 32.5 g/dL (ref 30.0–36.0)
MCV: 90.5 fL (ref 78.0–100.0)
PLATELETS: 295 10*3/uL (ref 150–400)
RBC: 3.16 MIL/uL — AB (ref 4.22–5.81)
RDW: 13.4 % (ref 11.5–15.5)
WBC: 5.7 10*3/uL (ref 4.0–10.5)

## 2016-06-01 MED ORDER — POTASSIUM CHLORIDE CRYS ER 20 MEQ PO TBCR
40.0000 meq | EXTENDED_RELEASE_TABLET | Freq: Two times a day (BID) | ORAL | Status: AC
  Administered 2016-06-01 (×2): 40 meq via ORAL
  Filled 2016-06-01 (×2): qty 2

## 2016-06-01 MED ORDER — WARFARIN SODIUM 5 MG PO TABS
5.0000 mg | ORAL_TABLET | Freq: Once | ORAL | Status: AC
  Administered 2016-06-01: 5 mg via ORAL
  Filled 2016-06-01: qty 1

## 2016-06-01 NOTE — Discharge Instructions (Signed)

## 2016-06-01 NOTE — Care Management Obs Status (Signed)
[facility] NOTIFICATION   Patient Details  Name: TACOMA DRUMM MRN: [id number] Date of Birth: Jul 17, 1928   Medicare Observation Status Notification Given:    Patient's son had questions regarding observation letter , called and explained over phone. Remo Lipps Talamante voiced understanding.  Nalan Capizzi (Son) Showing 2 of 2    [phone]     Marilu Favre, RN 06/01/2016, 10:33 AM
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
## 2016-06-01 NOTE — Progress Notes (Signed)
PROGRESS NOTE    Richard Jacobs  [id number] DOB: [birth year] DOA: 05/30/2016 PCP: Geoffery Lyons, MD    Brief Narrative: Richard Jacobs is a 82 y.o. male with medical history significant of ambulatory function and the recent fall. About 3 months ago patient has been developing worsening generalized weakness, and had another fall about a week ago, presents with left lower extremity edema, was found to have DVT in the left lower extremity.    Assessment & Plan:   Active Problems:   DVT (deep vein thrombosis) in pregnancy   DVT (deep venous thrombosis), left   DVT of the left lower extremity: Started him on lovenox and coumadin.  Will probably need lifelong anticoagulation.  CT chest negative for PE.  rpeat cbc shows stable hemoglobin around 9.  Discussed with family at bedside.    RESOLVING pneumonia: Completed the course of the antibiotics. Afebrile and normal wbc count.   Multiple falls: Discussed with family regarding multiple falls and relative contraindication to anticoagulation.  PT evaluation ordered.   Stage 3 CKD: Stable   Hypothyroidism; Resume synthroid.   H/o Prostate cancer: Resume enzalutamide.   Normocytic anemia: Monitor.     DVT prophylaxis: (lovenox and coumadin Code Status: (Full) Family Communication:  wife at bedside.  Disposition Plan: pending further evaluation. Possible SNF in the next 1 to 2 days when bed available.    Consultants:   none   Procedures: venous duplex.   Antimicrobials:  One dose of levaquin.   Subjective: No new complaints.   Objective: Vitals:   05/31/16 1500 05/31/16 2110 06/01/16 0538 06/01/16 1332  BP: 122/70 110/60 111/69 (!) 107/58  Pulse: 77 81 79 85  Resp: 18 19 19 18   Temp: 98.9 F (37.2 C) 99.4 F (37.4 C) 99 F (37.2 C) 98.9 F (37.2 C)  TempSrc: Oral Oral Oral Oral  SpO2: 98% 94% 95% 95%  Weight:      Height:        Intake/Output Summary (Last 24 hours) at 06/01/16  1924 Last data filed at 06/01/16 1815  Gross per 24 hour  Intake              880 ml  Output              800 ml  Net               80 ml   Filed Weights   05/30/16 1814  Weight: 97.1 kg (214 lb 1.1 oz)    Examination:  General exam: Appears calm and comfortable  Respiratory system: Clear to auscultation. Respiratory effort normal. Cardiovascular system: S1 & S2 heard, RRR. No JVD,  Gastrointestinal system: Abdomen is nondistended, soft and nontender. No organomegaly or masses felt. Normal bowel sounds heard. Central nervous system: Alert and  Comfortable, confused.  Extremities: bilateral lower extremity edema, left greater than right. . Skin: No rashes, lesions or ulcers     Data Reviewed: I have personally reviewed following labs and imaging studies  CBC:  Recent Labs Lab 05/29/16 1800 05/31/16 0215 06/01/16 0400  WBC 6.9 5.6 5.7  NEUTROABS 4.5  --   --   HGB 10.0* 9.7* 9.3*  HCT 30.8* 29.4* 28.6*  MCV 90.3 90.2 90.5  PLT 279 300 [id number]   Basic Metabolic Panel:  Recent Labs Lab 05/29/16 1800 05/31/16 0215 06/01/16 0400  NA 137 139 138  K 3.8 3.9 3.2*  CL 108 110 106  CO2 24 24 23   GLUCOSE 108* 107*  90  BUN 9 6 7   CREATININE 1.24 1.31* 1.14  CALCIUM 8.8* 8.7* 8.5*   GFR: Estimated Creatinine Clearance: 55.3 mL/min (by C-G formula based on SCr of 1.14 mg/dL). Liver Function Tests:  Recent Labs Lab 05/31/16 0215  AST 23  ALT 21  ALKPHOS 75  BILITOT 0.3  PROT 5.3*  ALBUMIN 2.3*   No results for input(s): LIPASE, AMYLASE in the last 168 hours. No results for input(s): AMMONIA in the last 168 hours. Coagulation Profile:  Recent Labs Lab 05/31/16 0215 06/01/16 0400  INR 1.23 1.16   Cardiac Enzymes: No results for input(s): CKTOTAL, CKMB, CKMBINDEX, TROPONINI in the last 168 hours. BNP (last 3 results) No results for input(s): PROBNP in the last 8760 hours. HbA1C: No results for input(s): HGBA1C in the last 72 hours. CBG: No results for  input(s): GLUCAP in the last 168 hours. Lipid Profile: No results for input(s): CHOL, HDL, LDLCALC, TRIG, CHOLHDL, LDLDIRECT in the last 72 hours. Thyroid Function Tests: No results for input(s): TSH, T4TOTAL, FREET4, T3FREE, THYROIDAB in the last 72 hours. Anemia Panel: No results for input(s): VITAMINB12, FOLATE, FERRITIN, TIBC, IRON, RETICCTPCT in the last 72 hours. Sepsis Labs: No results for input(s): PROCALCITON, LATICACIDVEN in the last 168 hours.  No results found for this or any previous visit (from the past 240 hour(s)).       Radiology Studies: No results found.      Scheduled Meds: . enoxaparin (LOVENOX) injection  1 mg/kg Subcutaneous Q12H  . enzalutamide  160 mg Oral QHS  . levothyroxine  112 mcg Oral Daily  . potassium chloride  40 mEq Oral BID  . sodium chloride flush  3 mL Intravenous Q12H  . Warfarin - Pharmacist Dosing Inpatient   Does not apply q1800   Continuous Infusions:    LOS: 1 day    Time spent: 30 minutes.     Hosie Poisson, MD [facility] Pager [phone]  If 7PM-7AM, please contact night-coverage [email or web address] Password [facility] 06/01/2016, 7:24 PM

## 2016-06-02 DIAGNOSIS — R531 Weakness: Secondary | ICD-10-CM | POA: Diagnosis not present

## 2016-06-02 DIAGNOSIS — I82402 Acute embolism and thrombosis of unspecified deep veins of left lower extremity: Secondary | ICD-10-CM | POA: Diagnosis not present

## 2016-06-02 LAB — PROTIME-INR
INR: 1.19
PROTHROMBIN TIME: 15.2 s (ref 11.4–15.2)

## 2016-06-02 LAB — CBC
HCT: 29.2 % — ABNORMAL LOW (ref 39.0–52.0)
Hemoglobin: 9.5 g/dL — ABNORMAL LOW (ref 13.0–17.0)
MCH: 29.4 pg (ref 26.0–34.0)
MCHC: 32.5 g/dL (ref 30.0–36.0)
MCV: 90.4 fL (ref 78.0–100.0)
PLATELETS: 310 10*3/uL (ref 150–400)
RBC: 3.23 MIL/uL — AB (ref 4.22–5.81)
RDW: 13.3 % (ref 11.5–15.5)
WBC: 6 10*3/uL (ref 4.0–10.5)

## 2016-06-02 LAB — POTASSIUM: POTASSIUM: 4.7 mmol/L (ref 3.5–5.1)

## 2016-06-02 MED ORDER — WARFARIN SODIUM 5 MG PO TABS
7.5000 mg | ORAL_TABLET | Freq: Once | ORAL | Status: AC
  Administered 2016-06-02: 7.5 mg via ORAL
  Filled 2016-06-02: qty 2

## 2016-06-02 MED ORDER — WARFARIN SODIUM 7.5 MG PO TABS: 7.5000 mg | ORAL_TABLET | Freq: Every day | ORAL | 0 refills | Status: DC

## 2016-06-02 MED ORDER — ENOXAPARIN SODIUM 150 MG/ML ~~LOC~~ SOLN
1.0000 mg/kg | Freq: Two times a day (BID) | SUBCUTANEOUS | 0 refills | Status: DC
Start: 2016-06-02 — End: 2016-09-29
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
## 2016-06-02 NOTE — Discharge Summary (Signed)
Physician Discharge Summary  Richard Jacobs [id number] DOB: July 17, 1928 DOA: 05/30/2016  PCP: Geoffery Lyons, MD  Admit date: 05/30/2016 Discharge date: 06/02/2016  Admitted From: HOME Disposition: SNF  Recommendations for Outpatient Follow-up:  1. Follow up with PCP in 1-2 weeks 2. Please follow up with PCP in one week.  3. Please check INR on Monday, Wednesday and Friday and if INR > or equal to 2, please stop the lovenox injections and continue with coumadin. Please dose the coumadin as per pharmacy.  Now since you were started on coumadin , we have stopped plavix to prevent increased chances of bleeding, please talk to your PCP regarding stopping plavix.     Discharge Condition:STABLE.  CODE STATUS:full code.  Diet recommendation: Heart Healthy   Brief/Interim Summary: Richard Jacobs a 82 y.o.malewith medical history significant of ambulatory function and the recent fall. About 3 months ago patient has been developing worsening generalized weakness, and had another fall about a week ago, presents with left lower extremity edema, was found to have DVT in the left lower extremity.   Discharge Diagnoses:  Active Problems:   DVT (deep vein thrombosis) in pregnancy   DVT (deep venous thrombosis), left   DVT of the left lower extremity: Started him on lovenox and coumadin. INR subtherapeutic. Please check INR on Monday, Wednesday and Friday and if INR > or equal to 2, please stop the lovenox injections and continue with coumadin. Please dose the coumadin as per pharmacy.  Now since you were started on coumadin , we have stopped plavix to prevent increased chances of bleeding, please talk to your PCP regarding the fact that we stopped plavix.  Will probably need lifelong anticoagulation.  CT chest negative for PE.  rpeat cbc shows stable hemoglobin around 9.  Discussed with family at bedside.    RESOLVING pneumonia: Completed the course of the antibiotics.  Afebrile and normal wbc count.   Multiple falls: Discussed with family regarding multiple falls and relative contraindication to anticoagulation.  PT evaluation ordered and going to sSNF.   Stage 3 CKD: Stable   Hypothyroidism; Resume synthroid.   H/o Prostate cancer: Resume enzalutamide.   Normocytic anemia: Monitor.    Discharge Instructions  Discharge Instructions    Call MD for:  difficulty breathing, headache or visual disturbances    Complete by:  As directed   Call MD for:  severe uncontrolled pain    Complete by:  As directed   Diet - low sodium heart healthy    Complete by:  As directed   Discharge instructions    Complete by:  As directed   Please follow up with PCP in one week.  Please check INR on Monday, Wednesday and Friday and if INR > or equal to 2, please stop the lovenox injections and continue with coumadin. Please dose the coumadin as per pharmacy.  Now since you were started on coumadin , we have stopped plavix to prevent increased chances of bleeding, please talk to your PCP regarding stopping plavix.   Increase activity slowly    Complete by:  As directed       Medication List    STOP taking these medications   clopidogrel 75 MG tablet Commonly known as:  PLAVIX   levofloxacin 750 MG tablet Commonly known as:  LEVAQUIN     TAKE these medications   acetaminophen 325 MG tablet Commonly known as:  TYLENOL Take 2 tablets (650 mg total) by mouth every 6 (six) hours as needed  for mild pain, fever or headache (or Fever >/= 101).   enoxaparin 150 MG/ML injection Commonly known as:  LOVENOX Inject 0.65 mLs (100 mg total) into the skin every 12 (twelve) hours.   enzalutamide 40 MG capsule Commonly known as:  XTANDI Take 160 mg by mouth at bedtime.   Ipratropium-Albuterol 20-100 MCG/ACT Aers respimat Commonly known as:  COMBIVENT RESPIMAT Inhale 1 puff into the lungs every 6 (six) hours as needed for shortness of breath.   levothyroxine  112 MCG tablet Commonly known as:  SYNTHROID, LEVOTHROID Take 112 mcg by mouth daily.   VITAMIN D3 PO Take 1,000 Units by mouth daily.   warfarin 7.5 MG tablet Commonly known as:  COUMADIN Take 1 tablet (7.5 mg total) by mouth daily at 6 PM.       No Known Allergies  Consultations:  none   Procedures/Studies: Dg Chest 2 View  Result Date: 8/80/2017 CLINICAL DATA:  Hospitalized with pneumonia last week. Presents the ED tonight for weakness and swelling and lower extremities. Patient also describes right-sided abdominal pain. EXAM: CHEST  2 VIEW COMPARISON:  Chest x-rays dated 05/23/2016 in 04/2079 2017. FINDINGS: Heart size is upper normal, stable. Overall cardiomediastinal silhouette is stable in size and configuration. Atherosclerotic changes again noted at the aortic arch. Probable mild atelectasis at the left lung base. Lungs now otherwise clear. No evidence of pneumonia on today's exam. No pleural effusion or pneumothorax seen. New sclerotic focus is seen within the lower portion of the scapula. Additional new sclerotic foci are appreciated within the lower thoracic spine and upper lumbar spine. IMPRESSION: 1. New sclerotic lesion is appreciated within the lower portion of the left scapula. Patient has a history of prostate cancer metastatic to bone (originally diagnosed in 1995 per report). This is concerning for new metastasis. Recommend chest CT for further characterization. 2. There are also new sclerotic foci within the lower thoracic spine and upper lumbar spine, likely T10 through L1 levels, also suspicious for osseous metastases. Recommend abdomen and pelvis CT for further characterization. 3. Probable mild atelectasis at the left lung base. No evidence of pneumonia on today's exam. 4. Aortic atherosclerosis. Recommend chest, abdomen and pelvis CT for the possible new osseous metastases described above. Electronically Signed   By: Franki Cabot M.D.   On: 05/29/2016 18:37   Dg  Chest 2 View  Result Date: 05/23/2016 CLINICAL DATA:  Pneumonia EXAM: CHEST  2 VIEW COMPARISON:  May 21, 2016 FINDINGS: There has been interval partial clearing of infiltrate from the left mid lung. A small amount of patchy opacity does remain in this area. There is also atelectatic change in the left base. There is minimal atelectasis in the lateral right base. Lungs elsewhere clear. Heart is mildly enlarged with pulmonary vascularity within normal limits. There is atherosclerotic calcification in the aorta. No adenopathy is evident. IMPRESSION: Interval partial clearing of patchy infiltrate left mid lung. Mild bibasilar atelectasis. Stable cardiac silhouette. There is aortic atherosclerosis. Electronically Signed   By: Lowella Grip III M.D.   On: 05/23/2016 07:20  Ct Head Wo Contrast  Result Date: 05/21/2016 CLINICAL DATA:  Post fall, now with confusion. History of prior right MCA distribution infarct. EXAM: CT HEAD WITHOUT CONTRAST TECHNIQUE: Contiguous axial images were obtained from the base of the skull through the vertex without intravenous contrast. COMPARISON:  10/02/2013; brain MRI - 10/02/2013 FINDINGS: Brain: Atrophy with sulcal prominence and centralized volume loss with commensurate expected dilatation of the ventricular system, progressed since the 09/2013  examination. Rather extensive periventricular hypodensities compatible microvascular ischemic disease, most conspicuous within the right centrum semiovale (image 23, series 201), also progressed since the 10/02/2013 examination. Given extensive background parenchymal abnormalities, there is no CT evidence superimposed acute large territory infarct. No intraparenchymal or extra-axial mass or hemorrhage. Normal configuration of the ventricles and basilar cisterns. No midline shift. Vascular: Intracranial atherosclerosis Skull: No displaced calvarial fracture. Sinuses/Orbits: There is complete opacification of the right frontal and anterior  ethmoidal air cells with polypoid mucosal thickening involving the imaged portions of the right maxillary sinus. The remaining paranasal sinuses and mastoid air cells are normally aerated. No air-fluid levels. Other: Regional soft tissues appear normal. IMPRESSION: 1. Progressive atrophy and microvascular ischemic disease without acute intracranial process. 2. Sinus disease as above.  No air-fluid levels. Electronically Signed   By: Sandi Mariscal M.D.   On: 05/21/2016 19:49  Ct Angio Chest Pe W And/or Wo Contrast  Result Date: 05/29/2016 CLINICAL DATA:  Pneumonia, sepsis. History of prostate cancer. Bilateral lower extremity swelling with elevated D-dimer. EXAM: CT ANGIOGRAPHY CHEST WITH CONTRAST TECHNIQUE: Multidetector CT imaging of the chest was performed using the standard protocol during bolus administration of intravenous contrast. Multiplanar CT image reconstructions and MIPs were obtained to evaluate the vascular anatomy. CONTRAST:  79 mL Isovue 370 IV. COMPARISON:  Chest x-ray 05/29/2016 and CT abdomen 01/07/2006 FINDINGS: Examination demonstrates small bilateral pleural effusions with mild associated airspace density most typical for atelectasis and less likely infection. Mild dependent atelectasis adjacent the left major fissure and mild linear atelectasis over the left lower lobe. Airways are within normal. Mild cardiomegaly. Calcified plaque over the mitral valve annulus. Calcified plaque at the aortic valve. Calcified plaque over the left main and left anterior descending coronary arteries. Mild calcified plaque over the thoracic aorta. Pulmonary arterial system is well opacified without evidence of emboli. Mild motion artifact is present making interpretation of the lower lobar pulmonary artery somewhat difficult. Remaining mediastinal structures are within normal. There is no mediastinal or hilar adenopathy. Images through the upper abdomen demonstrate no definite focal abnormality. There are  multiple sclerotic foci throughout the spine, sternum, ribs and scapula compatible with diffuse bony metastatic disease. Review of the MIP images confirms the above findings. IMPRESSION: No evidence of pulmonary embolism. Small bilateral pleural effusions with mild associated basilar airspace density most typical for atelectasis and less likely infection. Diffuse sclerotic osseous metastatic disease likely from patient's known prostate cancer. Mild cardiomegaly. Aortic atherosclerosis. Mild atherosclerotic coronary artery disease. Calcification over the mitral valve and aortic valves. Electronically Signed   By: Marin Olp M.D.   On: 05/29/2016 20:52   Dg Chest Port 1 View  Result Date: 05/21/2016 CLINICAL DATA:  82 year old male fell to floor from standing position. Generalize weakness. Fever. Initial encounter. EXAM: PORTABLE CHEST 1 VIEW COMPARISON:  10/02/2013 chest x-ray. FINDINGS: Probable skin folds bilaterally rather than pneumothorax. No obvious rib fracture. Consolidation projects over the lateral aspect of the left mid lung where overlying lead is noted. Follow-up two-view chest or single chest with leads removed recommended for further delineation. Calcified tortuous aorta. Cardiomegaly. Mild elevation left hemidiaphragm. Mild bilateral shoulder joint degenerative changes. IMPRESSION: Consolidation projects over the lateral aspect of the left mid lung where overlying lead is noted. Follow-up two-view chest or single chest with leads removed recommended for further delineation. Calcified tortuous aorta.  Aortic atherosclerosis. Cardiomegaly. Probable skin folds bilaterally rather than pneumothorax. No obvious rib fracture. Electronically Signed   By: Alcide Evener.D.  On: 05/21/2016 18:18      Subjective:  No new complaints.  Discharge Exam: Vitals:   06/01/16 2042 06/02/16 0430  BP: 134/64 (!) 142/80  Pulse: 85 85  Resp: 19 18  Temp: 100 F (37.8 C) 99 F (37.2 C)   Vitals:    06/01/16 0538 06/01/16 1332 06/01/16 2042 06/02/16 0430  BP: 111/69 (!) 107/58 134/64 (!) 142/80  Pulse: 79 85 85 85  Resp: 19 18 19 18   Temp: 99 F (37.2 C) 98.9 F (37.2 C) 100 F (37.8 C) 99 F (37.2 C)  TempSrc: Oral Oral Oral Oral  SpO2: 95% 95% 95% 94%  Weight:      Height:        General: Pt is alert, awake, not in acute distress Cardiovascular: RRR, S1/S2 +, no rubs, no gallops Respiratory: CTA bilaterally, no wheezing, no rhonchi Abdominal: Soft, NT, ND, bowel sounds +     The results of significant diagnostics from this hospitalization (including imaging, microbiology, ancillary and laboratory) are listed below for reference.     Microbiology: No results found for this or any previous visit (from the past 240 hour(s)).   Labs: BNP (last 3 results)  Recent Labs  05/22/16 0111 05/29/16 1800  BNP 408.9* [id number]*   Basic Metabolic Panel:  Recent Labs Lab 05/29/16 1800 05/31/16 0215 06/01/16 0400 06/02/16 1032  NA 137 139 138  --   K 3.8 3.9 3.2* 4.7  CL 108 110 106  --   CO2 24 24 23   --   GLUCOSE 108* 107* 90  --   BUN 9 6 7   --   CREATININE 1.24 1.31* 1.14  --   CALCIUM 8.8* 8.7* 8.5*  --    Liver Function Tests:  Recent Labs Lab 05/31/16 0215  AST 23  ALT 21  ALKPHOS 75  BILITOT 0.3  PROT 5.3*  ALBUMIN 2.3*   No results for input(s): LIPASE, AMYLASE in the last 168 hours. No results for input(s): AMMONIA in the last 168 hours. CBC:  Recent Labs Lab 05/29/16 1800 05/31/16 0215 06/01/16 0400 06/02/16 0341  WBC 6.9 5.6 5.7 6.0  NEUTROABS 4.5  --   --   --   HGB 10.0* 9.7* 9.3* 9.5*  HCT 30.8* 29.4* 28.6* 29.2*  MCV 90.3 90.2 90.5 90.4  PLT 279 300 295 310   Cardiac Enzymes: No results for input(s): CKTOTAL, CKMB, CKMBINDEX, TROPONINI in the last 168 hours. BNP: Invalid input(s): POCBNP CBG: No results for input(s): GLUCAP in the last 168 hours. D-Dimer No results for input(s): DDIMER in the last 72 hours. Hgb A1c No  results for input(s): HGBA1C in the last 72 hours. Lipid Profile No results for input(s): CHOL, HDL, LDLCALC, TRIG, CHOLHDL, LDLDIRECT in the last 72 hours. Thyroid function studies No results for input(s): TSH, T4TOTAL, T3FREE, THYROIDAB in the last 72 hours.  Invalid input(s): FREET3 Anemia work up No results for input(s): VITAMINB12, FOLATE, FERRITIN, TIBC, IRON, RETICCTPCT in the last 72 hours. Urinalysis    Component Value Date/Time   COLORURINE YELLOW 05/29/2016 2100   APPEARANCEUR CLEAR 05/29/2016 2100   LABSPEC 1.020 05/29/2016 2100   PHURINE 5.0 05/29/2016 2100   GLUCOSEU NEGATIVE 05/29/2016 2100   HGBUR TRACE (A) 05/29/2016 2100   BILIRUBINUR NEGATIVE 05/29/2016 2100   KETONESUR NEGATIVE 05/29/2016 2100   PROTEINUR NEGATIVE 05/29/2016 2100   UROBILINOGEN 0.2 10/02/2013 1400   NITRITE NEGATIVE 05/29/2016 2100   LEUKOCYTESUR NEGATIVE 05/29/2016 2100   Sepsis Labs Invalid input(s):  PROCALCITONIN,  WBC,  LACTICIDVEN Microbiology No results found for this or any previous visit (from the past 240 hour(s)).   Time coordinating discharge: Over 30 minutes  SIGNED:   Hosie Poisson, MD  [facility] 06/02/2016, 1:33 PM Pager   If 7PM-7AM, please contact night-coverage [email or web address] Password [email or web address]

## 2016-06-02 NOTE — Progress Notes (Signed)
Report given to Debo at [facility].  Pt to go via ambulance at 1700.  I will give the coumadin prior to him leaving and reported that to Debo so there will not be a delay in him getting the coumadin.  Wife to ride in the ambulance to [facility] with pt.

## 2016-06-02 NOTE — Progress Notes (Signed)
Called SW to let her know pt wife will need to ride in the ambulance to SNF if OK.  Also, need to ask them if they want me to give the coumadin prior to pt leaving today or not.  SW is checking with SNF to see if pt can go today (was saying did not have a nurse at this time to take).  SW will let me know what she finds out.

## 2016-06-02 NOTE — Progress Notes (Signed)
Coumadin 7.5 mg given.  [facility] here to get pt.

## 2016-06-02 NOTE — Clinical Social Work Note (Signed)
MD notified CSW pt medically stable for discharge to SNF. CSW contacted Education officer, museum at [employer] and informed her that the pt discharging today and both pt and his wife will arrive to SNF via [place]. CSW contacted [facility] and requested nurse contact SNF and give report.

## 2016-06-02 NOTE — Progress Notes (Signed)
Pt and wife ready to go.  Rx from Pharmacy (kept in Pharmacy) given back to wife and paper signed.

## 2016-06-04 ENCOUNTER — Encounter: Payer: Self-pay | Admitting: Internal Medicine

## 2016-06-04 ENCOUNTER — Non-Acute Institutional Stay (SKILLED_NURSING_FACILITY): Payer: Medicare Other | Admitting: Internal Medicine

## 2016-06-04 DIAGNOSIS — E038 Other specified hypothyroidism: Secondary | ICD-10-CM | POA: Diagnosis not present

## 2016-06-04 DIAGNOSIS — D638 Anemia in other chronic diseases classified elsewhere: Secondary | ICD-10-CM

## 2016-06-04 DIAGNOSIS — I82402 Acute embolism and thrombosis of unspecified deep veins of left lower extremity: Secondary | ICD-10-CM

## 2016-06-04 DIAGNOSIS — W19XXXS Unspecified fall, sequela: Secondary | ICD-10-CM | POA: Diagnosis not present

## 2016-06-04 DIAGNOSIS — C61 Malignant neoplasm of prostate: Secondary | ICD-10-CM | POA: Diagnosis not present

## 2016-06-04 DIAGNOSIS — N183 Chronic kidney disease, stage 3 (moderate): Secondary | ICD-10-CM | POA: Diagnosis not present

## 2016-06-04 DIAGNOSIS — R531 Weakness: Secondary | ICD-10-CM

## 2016-06-04 DIAGNOSIS — E46 Unspecified protein-calorie malnutrition: Secondary | ICD-10-CM

## 2016-06-04 DIAGNOSIS — Z8673 Personal history of transient ischemic attack (TIA), and cerebral infarction without residual deficits: Secondary | ICD-10-CM | POA: Diagnosis not present

## 2016-06-04 DIAGNOSIS — IMO0001 Reserved for inherently not codable concepts without codable children: Secondary | ICD-10-CM

## 2016-06-04 DIAGNOSIS — R030 Elevated blood-pressure reading, without diagnosis of hypertension: Secondary | ICD-10-CM | POA: Diagnosis not present
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
## 2016-06-12 LAB — POCT INR: INR: 2.8 — AB (ref 0.9–1.1)

## 2016-06-13 LAB — POCT INR: INR: 2.9 — AB (ref 0.9–1.1)

## 2016-06-14 LAB — POCT INR: INR: 3 — AB (ref 0.9–1.1)

## 2016-06-15 ENCOUNTER — Encounter: Payer: Self-pay | Admitting: Family

## 2016-06-15 ENCOUNTER — Non-Acute Institutional Stay (SKILLED_NURSING_FACILITY): Payer: Medicare Other | Admitting: Family

## 2016-06-15 DIAGNOSIS — R2681 Unsteadiness on feet: Secondary | ICD-10-CM | POA: Diagnosis not present

## 2016-06-15 DIAGNOSIS — N183 Chronic kidney disease, stage 3 (moderate): Secondary | ICD-10-CM

## 2016-06-15 DIAGNOSIS — C61 Malignant neoplasm of prostate: Secondary | ICD-10-CM | POA: Diagnosis not present

## 2016-06-15 DIAGNOSIS — E039 Hypothyroidism, unspecified: Secondary | ICD-10-CM | POA: Diagnosis not present

## 2016-06-15 DIAGNOSIS — R531 Weakness: Secondary | ICD-10-CM | POA: Diagnosis not present

## 2016-06-15 DIAGNOSIS — E46 Unspecified protein-calorie malnutrition: Secondary | ICD-10-CM | POA: Diagnosis not present

## 2016-06-15 DIAGNOSIS — I82402 Acute embolism and thrombosis of unspecified deep veins of left lower extremity: Secondary | ICD-10-CM | POA: Diagnosis not present

## 2016-06-15 DIAGNOSIS — D638 Anemia in other chronic diseases classified elsewhere: Secondary | ICD-10-CM

## 2016-06-15 DIAGNOSIS — I639 Cerebral infarction, unspecified: Secondary | ICD-10-CM | POA: Diagnosis not present
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
## 2016-09-28 ENCOUNTER — Emergency Department (HOSPITAL_COMMUNITY): Payer: Medicare Other

## 2016-09-28 ENCOUNTER — Emergency Department (HOSPITAL_BASED_OUTPATIENT_CLINIC_OR_DEPARTMENT_OTHER): Admit: 2016-09-28 | Discharge: 2016-09-28 | Disposition: A | Discharge status: Home / Self Care | Payer: Medicare Other

## 2016-09-28 ENCOUNTER — Observation Stay (HOSPITAL_COMMUNITY)
Admission: EM | Admit: 2016-09-28 | Discharge: 2016-09-29 | Disposition: A | Discharge status: Home Health | Payer: Medicare Other | Attending: Internal Medicine | Admitting: Internal Medicine

## 2016-09-28 ENCOUNTER — Encounter (HOSPITAL_COMMUNITY): Payer: Self-pay | Admitting: *Deleted

## 2016-09-28 DIAGNOSIS — M7989 Other specified soft tissue disorders: Secondary | ICD-10-CM | POA: Diagnosis not present

## 2016-09-28 DIAGNOSIS — N390 Urinary tract infection, site not specified: Secondary | ICD-10-CM | POA: Diagnosis not present

## 2016-09-28 DIAGNOSIS — F17290 Nicotine dependence, other tobacco product, uncomplicated: Secondary | ICD-10-CM | POA: Insufficient documentation

## 2016-09-28 DIAGNOSIS — G620 Drug-induced polyneuropathy: Secondary | ICD-10-CM

## 2016-09-28 DIAGNOSIS — Z79899 Other long term (current) drug therapy: Secondary | ICD-10-CM | POA: Insufficient documentation

## 2016-09-28 DIAGNOSIS — N183 Chronic kidney disease, stage 3 (moderate): Secondary | ICD-10-CM | POA: Insufficient documentation

## 2016-09-28 DIAGNOSIS — Z8546 Personal history of malignant neoplasm of prostate: Secondary | ICD-10-CM | POA: Insufficient documentation

## 2016-09-28 DIAGNOSIS — F0390 Unspecified dementia without behavioral disturbance: Secondary | ICD-10-CM | POA: Insufficient documentation

## 2016-09-28 DIAGNOSIS — M79609 Pain in unspecified limb: Secondary | ICD-10-CM

## 2016-09-28 DIAGNOSIS — C7951 Secondary malignant neoplasm of bone: Secondary | ICD-10-CM | POA: Insufficient documentation

## 2016-09-28 DIAGNOSIS — R55 Syncope and collapse: Secondary | ICD-10-CM | POA: Diagnosis not present

## 2016-09-28 DIAGNOSIS — Z9079 Acquired absence of other genital organ(s): Secondary | ICD-10-CM | POA: Diagnosis not present

## 2016-09-28 DIAGNOSIS — E039 Hypothyroidism, unspecified: Secondary | ICD-10-CM | POA: Diagnosis not present

## 2016-09-28 DIAGNOSIS — I82412 Acute embolism and thrombosis of left femoral vein: Secondary | ICD-10-CM | POA: Diagnosis not present

## 2016-09-28 DIAGNOSIS — M160 Bilateral primary osteoarthritis of hip: Secondary | ICD-10-CM | POA: Diagnosis not present

## 2016-09-28 DIAGNOSIS — I82411 Acute embolism and thrombosis of right femoral vein: Secondary | ICD-10-CM | POA: Diagnosis not present

## 2016-09-28 DIAGNOSIS — R319 Hematuria, unspecified: Secondary | ICD-10-CM | POA: Diagnosis present

## 2016-09-28 DIAGNOSIS — Z8673 Personal history of transient ischemic attack (TIA), and cerebral infarction without residual deficits: Secondary | ICD-10-CM | POA: Diagnosis not present

## 2016-09-28 DIAGNOSIS — C61 Malignant neoplasm of prostate: Secondary | ICD-10-CM | POA: Diagnosis not present

## 2016-09-28 DIAGNOSIS — G629 Polyneuropathy, unspecified: Secondary | ICD-10-CM | POA: Diagnosis not present

## 2016-09-28 DIAGNOSIS — T451X5A Adverse effect of antineoplastic and immunosuppressive drugs, initial encounter: Secondary | ICD-10-CM

## 2016-09-28 DIAGNOSIS — I82409 Acute embolism and thrombosis of unspecified deep veins of unspecified lower extremity: Secondary | ICD-10-CM | POA: Diagnosis present

## 2016-09-28 DIAGNOSIS — W010XXA Fall on same level from slipping, tripping and stumbling without subsequent striking against object, initial encounter: Secondary | ICD-10-CM | POA: Diagnosis not present

## 2016-09-28 DIAGNOSIS — W19XXXA Unspecified fall, initial encounter: Secondary | ICD-10-CM

## 2016-09-28 DIAGNOSIS — R011 Cardiac murmur, unspecified: Secondary | ICD-10-CM

## 2016-09-28 DIAGNOSIS — I350 Nonrheumatic aortic (valve) stenosis: Secondary | ICD-10-CM | POA: Insufficient documentation

## 2016-09-28 LAB — URINALYSIS, ROUTINE W REFLEX MICROSCOPIC
Bilirubin Urine: NEGATIVE
Glucose, UA: NEGATIVE mg/dL
Ketones, ur: NEGATIVE mg/dL
Nitrite: POSITIVE — AB
PROTEIN: 30 mg/dL — AB
Specific Gravity, Urine: 1.024 (ref 1.005–1.030)
pH: 5 (ref 5.0–8.0)

## 2016-09-28 LAB — BASIC METABOLIC PANEL
Anion gap: 7 (ref 5–15)
BUN: 25 mg/dL — AB (ref 6–20)
CHLORIDE: 105 mmol/L (ref 101–111)
CO2: 27 mmol/L (ref 22–32)
CREATININE: 1.11 mg/dL (ref 0.61–1.24)
Calcium: 9.3 mg/dL (ref 8.9–10.3)
GFR calc Af Amer: >60 mL/min (ref >60)
GFR calc non Af Amer: 58 mL/min — ABNORMAL LOW (ref >60)
GLUCOSE: 118 mg/dL — AB (ref 65–99)
POTASSIUM: 4.1 mmol/L (ref 3.5–5.1)
SODIUM: 139 mmol/L (ref 135–145)

## 2016-09-28 LAB — CBC WITH DIFFERENTIAL/PLATELET
Basophils Absolute: 0 10*3/uL (ref 0.0–0.1)
Basophils Relative: 0 %
EOS PCT: 0 %
Eosinophils Absolute: 0 10*3/uL (ref 0.0–0.7)
HEMATOCRIT: 35 % — AB (ref 39.0–52.0)
Hemoglobin: 12.1 g/dL — ABNORMAL LOW (ref 13.0–17.0)
LYMPHS PCT: 13 %
Lymphs Abs: 1.5 10*3/uL (ref 0.7–4.0)
MCH: 28.7 pg (ref 26.0–34.0)
MCHC: 34.6 g/dL (ref 30.0–36.0)
MCV: 82.9 fL (ref 78.0–100.0)
MONO ABS: 1.2 10*3/uL — AB (ref 0.1–1.0)
MONOS PCT: 10 %
NEUTROS ABS: 8.8 10*3/uL — AB (ref 1.7–7.7)
Neutrophils Relative %: 77 %
PLATELETS: 161 10*3/uL (ref 150–400)
RBC: 4.22 MIL/uL (ref 4.22–5.81)
RDW: 14.9 % (ref 11.5–15.5)
WBC: 11.5 10*3/uL — ABNORMAL HIGH (ref 4.0–10.5)

## 2016-09-28 LAB — PROTIME-INR
INR: 1.15
Prothrombin Time: 14.8 seconds (ref 11.4–15.2)

## 2016-09-28 LAB — URINE MICROSCOPIC-ADD ON

## 2016-09-28 LAB — POC OCCULT BLOOD, ED: Fecal Occult Bld: NEGATIVE

## 2016-09-28 LAB — APTT: aPTT: 35 seconds (ref 24–36)

## 2016-09-28 MED ORDER — ENZALUTAMIDE 40 MG PO CAPS: 160.0000 mg | ORAL_CAPSULE | Freq: Every day | ORAL | Status: DC

## 2016-09-28 MED ORDER — HEPARIN BOLUS VIA INFUSION
3000.0000 [IU] | Freq: Once | INTRAVENOUS | Status: AC
  Administered 2016-09-28: 3000 [IU] via INTRAVENOUS
  Filled 2016-09-28: qty 3000

## 2016-09-28 MED ORDER — ACETAMINOPHEN 325 MG PO TABS: 650.0000 mg | ORAL_TABLET | Freq: Four times a day (QID) | ORAL | Status: DC | PRN

## 2016-09-28 MED ORDER — POLYETHYLENE GLYCOL 3350 17 G PO PACK: 17.0000 g | PACK | Freq: Every day | ORAL | Status: DC | PRN

## 2016-09-28 MED ORDER — SODIUM CHLORIDE 0.9 % IV SOLN
INTRAVENOUS | Status: AC
  Administered 2016-09-28: via INTRAVENOUS

## 2016-09-28 MED ORDER — ACETAMINOPHEN 650 MG RE SUPP: 650.0000 mg | Freq: Four times a day (QID) | RECTAL | Status: DC | PRN

## 2016-09-28 MED ORDER — ONDANSETRON HCL 4 MG PO TABS: 4.0000 mg | ORAL_TABLET | Freq: Four times a day (QID) | ORAL | Status: DC | PRN

## 2016-09-28 MED ORDER — DEXTROSE 5 % IV SOLN
1.0000 g | INTRAVENOUS | Status: DC
  Administered 2016-09-28: 1 g via INTRAVENOUS
  Filled 2016-09-28 (×2): qty 10

## 2016-09-28 MED ORDER — HYDROCODONE-ACETAMINOPHEN 5-325 MG PO TABS: 1.0000 | ORAL_TABLET | ORAL | Status: DC | PRN

## 2016-09-28 MED ORDER — HEPARIN (PORCINE) IN NACL 100-0.45 UNIT/ML-% IJ SOLN
1400.0000 [IU]/h | INTRAMUSCULAR | Status: DC
Start: 2016-09-28 — End: 2016-09-29
  Administered 2016-09-28 – 2016-09-29 (×2): 1400 [IU]/h via INTRAVENOUS
  Filled 2016-09-28 (×3): qty 250

## 2016-09-28 MED ORDER — BISACODYL 10 MG RE SUPP: 10.0000 mg | Freq: Every day | RECTAL | Status: DC | PRN

## 2016-09-28 MED ORDER — SODIUM CHLORIDE 0.9% FLUSH
3.0000 mL | Freq: Two times a day (BID) | INTRAVENOUS | Status: DC
  Administered 2016-09-28: 3 mL via INTRAVENOUS

## 2016-09-28 MED ORDER — ONDANSETRON HCL 4 MG/2ML IJ SOLN: 4.0000 mg | Freq: Four times a day (QID) | INTRAMUSCULAR | Status: DC | PRN

## 2016-09-28 NOTE — Progress Notes (Signed)
*  PRELIMINARY RESULTS* Vascular Ultrasound Lower extremity venous duplex has been completed.  Preliminary findings: Partially mobile DVT noted in the left proximal femoral vein. No DVT RLE. Bilateral small saphenous veins superficial thrombosis.   Landry Mellow, RDMS, RVT  09/28/2016, 6:07 PM

## 2016-09-28 NOTE — ED Notes (Signed)
Family at bedside. 
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
## 2016-09-28 NOTE — ED Triage Notes (Signed)
Per EMS, pt from home, reports BRB stool x 3 today.  Pt also reports gen weakness x 4 days, worse today, unable to ambulate on his own.  Pt was also hypoxic at 91% RA.  Pt is A&O x 4.

## 2016-09-28 NOTE — ED Notes (Signed)
Dr.Doutova at bedside to evaluate pt.  

## 2016-09-28 NOTE — ED Notes (Signed)
US at bedside
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
## 2016-09-28 NOTE — ED Notes (Signed)
Pt is not able to use urinal.

## 2016-09-28 NOTE — ED Notes (Signed)
Assisted EDPA with rectal exam

## 2016-09-28 NOTE — ED Notes (Signed)
Will complete In & Out when ultra sound is complete.

## 2016-09-29 ENCOUNTER — Observation Stay (HOSPITAL_BASED_OUTPATIENT_CLINIC_OR_DEPARTMENT_OTHER): Payer: Medicare Other

## 2016-09-29 ENCOUNTER — Other Ambulatory Visit: Payer: Self-pay

## 2016-09-29 ENCOUNTER — Observation Stay (HOSPITAL_COMMUNITY): Payer: Medicare Other

## 2016-09-29 DIAGNOSIS — I82411 Acute embolism and thrombosis of right femoral vein: Secondary | ICD-10-CM

## 2016-09-29 DIAGNOSIS — N3001 Acute cystitis with hematuria: Secondary | ICD-10-CM | POA: Diagnosis not present

## 2016-09-29 DIAGNOSIS — R55 Syncope and collapse: Secondary | ICD-10-CM | POA: Diagnosis not present

## 2016-09-29 DIAGNOSIS — I82412 Acute embolism and thrombosis of left femoral vein: Secondary | ICD-10-CM | POA: Diagnosis not present

## 2016-09-29 DIAGNOSIS — F0390 Unspecified dementia without behavioral disturbance: Secondary | ICD-10-CM

## 2016-09-29 DIAGNOSIS — G629 Polyneuropathy, unspecified: Secondary | ICD-10-CM | POA: Diagnosis not present

## 2016-09-29 DIAGNOSIS — R011 Cardiac murmur, unspecified: Secondary | ICD-10-CM

## 2016-09-29 DIAGNOSIS — Z79899 Other long term (current) drug therapy: Secondary | ICD-10-CM | POA: Diagnosis not present

## 2016-09-29 LAB — CBC
HEMATOCRIT: 33.5 % — AB (ref 39.0–52.0)
HEMOGLOBIN: 11.1 g/dL — AB (ref 13.0–17.0)
MCH: 28.2 pg (ref 26.0–34.0)
MCHC: 33.1 g/dL (ref 30.0–36.0)
MCV: 85.2 fL (ref 78.0–100.0)
Platelets: 158 10*3/uL (ref 150–400)
RBC: 3.93 MIL/uL — ABNORMAL LOW (ref 4.22–5.81)
RDW: 15 % (ref 11.5–15.5)
WBC: 8.7 10*3/uL (ref 4.0–10.5)

## 2016-09-29 LAB — COMPREHENSIVE METABOLIC PANEL
ALK PHOS: 78 U/L (ref 38–126)
ALT: 18 U/L (ref 17–63)
ANION GAP: 9 (ref 5–15)
AST: 50 U/L — ABNORMAL HIGH (ref 15–41)
Albumin: 3.1 g/dL — ABNORMAL LOW (ref 3.5–5.0)
BUN: 24 mg/dL — ABNORMAL HIGH (ref 6–20)
CALCIUM: 8.9 mg/dL (ref 8.9–10.3)
CO2: 27 mmol/L (ref 22–32)
Chloride: 104 mmol/L (ref 101–111)
Creatinine, Ser: 1.03 mg/dL (ref 0.61–1.24)
GFR calc non Af Amer: >60 mL/min (ref >60)
Glucose, Bld: 120 mg/dL — ABNORMAL HIGH (ref 65–99)
POTASSIUM: 3.6 mmol/L (ref 3.5–5.1)
SODIUM: 140 mmol/L (ref 135–145)
TOTAL PROTEIN: 5.8 g/dL — AB (ref 6.5–8.1)
Total Bilirubin: 0.7 mg/dL (ref 0.3–1.2)

## 2016-09-29 LAB — PHOSPHORUS: Phosphorus: 3.7 mg/dL (ref 2.5–4.6)

## 2016-09-29 LAB — HEPARIN LEVEL (UNFRACTIONATED)
HEPARIN UNFRACTIONATED: 0.37 [IU]/mL (ref 0.30–0.70)
Heparin Unfractionated: 0.33 IU/mL (ref 0.30–0.70)

## 2016-09-29 LAB — TROPONIN I
TROPONIN I: 0.05 ng/mL — AB (ref <0.03)
TROPONIN I: 0.04 ng/mL — AB (ref <0.03)
Troponin I: 0.07 ng/mL (ref <0.03)

## 2016-09-29 LAB — MAGNESIUM: MAGNESIUM: 1.8 mg/dL (ref 1.7–2.4)

## 2016-09-29 LAB — ECHOCARDIOGRAM COMPLETE
Height: 71 in
WEIGHTICAEL: 2828.8 [oz_av]

## 2016-09-29 MED ORDER — CIPROFLOXACIN HCL 500 MG PO TABS: 500.0000 mg | ORAL_TABLET | Freq: Two times a day (BID) | ORAL | 0 refills | Status: AC

## 2016-09-29 MED ORDER — SODIUM CHLORIDE 0.9 % IV SOLN
INTRAVENOUS | Status: DC
  Administered 2016-09-29: 11:00:00 via INTRAVENOUS

## 2016-09-29 MED ORDER — CHLORHEXIDINE GLUCONATE 0.12 % MT SOLN: 15.0000 mL | Freq: Two times a day (BID) | OROMUCOSAL | Status: DC

## 2016-09-29 MED ORDER — ORAL CARE MOUTH RINSE: 15.0000 mL | Freq: Two times a day (BID) | OROMUCOSAL | Status: DC

## 2016-09-29 NOTE — Progress Notes (Signed)
*  PRELIMINARY RESULTS* Echocardiogram 2D Echocardiogram has been performed.  Beryle Beams 09/29/2016, 2:47 PM

## 2016-09-29 NOTE — Care Management Note (Signed)
Case Management Note  Patient Details  Name: WATSON GELB MRN: [id number] Date of Birth: Feb 26, 1928  Subjective/Objective:       UTI             Action/Plan: Discharge Planning: AVS reviewed:  NCM spoke to pt's wife, and offered choice for [facility]. Pt had [facility] in the past. Requesting [facility] for [facility]. Pt has RW and wheelchair at home. Contacted [facility] Liaison with new referral.   PCP Burnard Bunting MD    Expected Discharge Date:           Expected Discharge Plan:  [place]  In-House Referral:  NA  Discharge planning Services  CM Consult  Post Acute Care Choice:  Home Health Choice offered to:  Patient  DME Arranged:  N/A DME Agency:  NA  HH Arranged:  PT [place] Agency:  [place]  Status of Service:  Completed, signed off  If discussed at [place] of Stay Meetings, dates discussed:    Additional Comments:  Erenest Rasher, RN 09/29/2016, 2:28 PM

## 2016-09-29 NOTE — Progress Notes (Signed)
Pt was very weak upon standing when it was time for patient discharge. Janett Billow, charge nurse, helped assist patient to wheelchair. Nursing staff asked patient and wife about possible skilled nursing facility placement. Patient and wife refused. Stated they have [facility] who is on-call 24/7 and they have a son at home who will help the patient get out of the car at home. Wife was also concerned that their personal walker was left in the room. Room 1407 checked by nursing staff and walker was not present.

## 2016-09-29 NOTE — Progress Notes (Signed)
CRITICAL VALUE ALERT  Critical value received:  Troponin 0.07  Date of notification: 09/29/16  Time of notification:  0017  Critical value read back:Yes.    Nurse who received alert:  Reynold Bowen  MD notified (1st page):  Walden Field  Time of first page:  0033  MD notified (2nd page):  Time of second page:  Responding MD: Walden Field  Time MD responded:  0034  No new orders given.

## 2016-09-29 NOTE — Progress Notes (Addendum)
ANTICOAGULATION CONSULT NOTE  Pharmacy Consult for IV heparin Indication: DVT  No Known Allergies  Patient Measurements: Height: 5\' 11"  (180.3 cm) Weight: 176 lb 12.8 oz (80.2 kg) IBW/kg (Calculated) : 75.3 Heparin Dosing Weight: TBW  Vital Signs: Temp: 99 F (37.2 C) (12/02 0425) Temp Source: Oral (12/02 0425) BP: 141/65 (12/02 0425) Pulse Rate: 82 (12/02 0425)  Labs:  Recent Labs  09/28/16 1644 09/28/16 2317 09/29/16 0551 09/29/16 0552 09/29/16 1025  HGB 12.1*  --  11.1*  --   --   HCT 35.0*  --  33.5*  --   --   PLT 161  --  158  --   --   APTT 35  --   --   --   --   LABPROT 14.8  --   --   --   --   INR 1.15  --   --   --   --   HEPARINUNFRC  --   --   --  0.37 0.33  CREATININE 1.11  --  1.03  --   --   TROPONINI  --  0.07* 0.05*  --   --     Estimated Creatinine Clearance: 54.8 mL/min (by C-G formula based on SCr of 1.03 mg/dL).   Assessment: 37 yoM w/ Hx prostate cancer; DVT in August 2017. Patient was started on Warfarin/Lovenox after DVT but this has since been stopped and patient is not sure why. Presented today s/p fall and with L thigh pain x 2 wks. LE doppler on 12/1 reveals mobile DVT in LLE.  Hematuria noted on admission and FOBT negative.   Baseline INR = 1.15  Prior anticoagulation: none recently; was discharged 06/02/16 on 7.5 mg daily but noted INR > 7 on 8/29 (unclear what warfarin dose was at that time)  Significant events:  Today, 09/29/2016:  Heparin level therapeutic x 2  CBC: Hgb slightly low; plt wnl  Hematuria noted at admission but resolved.  No more visible hematuria  Discussed with Dr. Maryland Pink. DOAC not a good choice d/t Xtandi being a major CYP 3A4 inhibitor.  Pradaxa could possibly be considered but appears not much known about its effects and/or degree of effect on p-glycoprotein.  Dr. Maryland Pink to f/u with PCP to determine if he knows why anticoagulation stopped  Goal of Therapy: Heparin level 0.3-0.7 units/ml Monitor  platelets by anticoagulation protocol: Yes  Plan:  Continue Heparin 1400 units/hr IV infusion  Daily CBC, daily heparin level   Monitor for signs of bleeding or thrombosis; may want to truncate goal range to 0.3-0.5 if hematuria worsens  Doreene Eland, PharmD, BCPS.   Pager: [phone] 09/29/2016 11:07 AM

## 2016-09-29 NOTE — Discharge Summary (Addendum)
Discharge Summary  Richard Jacobs [id number] DOB: 1928-04-11  PCP: Geoffery Lyons, MD  Admit date: 09/28/2016 Discharge date: 09/29/2016  Time spent: 35 minutes   Recommendations for Outpatient Follow-up:  1. Patient will follow-up with his PCP early next week. He can also be assessed for an IVC filter for his DVT. Results of both his urine cultures and echocardiogram can be followed up on. 2. New medication: Cipro 500 mg by mouth twice a day 4 days  3. Patient will be discharged with home health PT  Discharge Diagnoses:  Active Hospital Problems   Diagnosis Date Noted  . UTI (urinary tract infection) 09/28/2016  . Hematuria 09/28/2016  . DVT (deep venous thrombosis) ([place]) 05/30/2016  . Neuropathy due to chemotherapeutic drug ([place]) 11/30/2014  . Malignant neoplasm of prostate ([place]) 04/17/2012    Resolved Hospital Problems   Diagnosis Date Noted Date Resolved  No resolved problems to display.    Discharge Condition: Improved, being discharged home  Diet recommendation: Low-sodium   Vitals:   09/29/16 0300 09/29/16 0425  BP: (!) 163/75 (!) 141/65  Pulse: 83 82  Resp: (!) 22 (!) 21  Temp: 99 F (37.2 C) 99 F (37.2 C)    History of present illness:  Patient is an 82 year old male with past medical history of malignant prostate cancer , dementia and DVT diagnosed approximately 4 months ago. Patient presented to emergency room on 12/1 after he fell while using the commode. He reports that he has been having left thigh pain for several weeks. EMS noted blood in the commode and while his Hemoccult was negative, ER noted that there was blood in his urine. The patient's wife thinks he may have passed out. Patient was brought into the emergency room where he was noted to have a large amount of hematuria plus a large UTI. He is also found to have a partially mobile DVT in the left proximal femoral vein. Patient is wife are very forgetful, and could not remember anything  about the previous DVT and why his blood thinning medication was stopped last time. Patient was brought into the hospitalist service.  Hospital Course:  Active Problems:   Malignant neoplasm of prostate (HCC)   Neuropathy due to chemotherapeutic drug ([place])   DVT (deep venous thrombosis) ([place]): Discussed this with patient's PCP. The patient and his wife were insistent on stopping his Lovenox and Coumadin about a month after it was started. They were quite adamant according the patient's PCP. We felt the best course of action for this patient would be to be discharged home and to follow-up with PCP next week that time, discussion can be had over IVC filter.  Syncope: Suspect secondary to dehydration brought on by UTI. Vitals now stable. Treated with IV fluids. Patient had an echocardiogram done prior to discharge. Results will be followed up by his PCP.    UTI (urinary tract infection): Started on IV Rocephin. White count is normal. No signs of sepsis. We'll plan to discharge on 4 more days of Cipro for a five-day course and patient's PCP will follow up on urine cultures.    Hematuria: Felt to likely be related to the patient's prostate. Also would be made worse by patient being on blood thinning medication.   Procedures:   bilateral lower extremity Doppler done 12/1: Partially mobile DVT left proximal femoral vein, bilateral small saphenous vein superficial thromboses  Echocardiogram done 12/2: Results pending  Consultations:   none   Discharge Exam: BP (!) 141/65 (BP  Location: Right Arm)   Pulse 82   Temp 99 F (37.2 C) (Oral)   Resp (!) 21   Ht 5\' 11"  (1.803 m)   Wt 80.2 kg (176 lb 12.8 oz)   SpO2 98%   BMI 24.66 kg/m   General: alert and oriented 2, no acute distress  Cardiovascular: regular rate and rhythm, [id number], 2/6 holosystolic murmur  Respiratory: clear to auscultation bilaterally   Discharge Instructions You were cared for by a hospitalist during your hospital stay.  If you have any questions about your discharge medications or the care you received while you were in the hospital after you are discharged, you can call the unit and asked to speak with the hospitalist on call if the hospitalist that took care of you is not available. Once you are discharged, your primary care physician will handle any further medical issues. Please note that NO REFILLS for any discharge medications will be authorized once you are discharged, as it is imperative that you return to your primary care physician (or establish a relationship with a primary care physician if you do not have one) for your aftercare needs so that they can reassess your need for medications and monitor your lab values.  Discharge Instructions    Diet - low sodium heart healthy    Complete by:  As directed    Increase activity slowly    Complete by:  As directed        Medication List    STOP taking these medications   enoxaparin 150 MG/ML injection Commonly known as:  LOVENOX     TAKE these medications   acetaminophen 325 MG tablet Commonly known as:  TYLENOL Take 2 tablets (650 mg total) by mouth every 6 (six) hours as needed for mild pain, fever or headache (or Fever >/= 101).   ciprofloxacin 500 MG tablet Commonly known as:  CIPRO Take 1 tablet (500 mg total) by mouth 2 (two) times daily.   enzalutamide 40 MG capsule Commonly known as:  XTANDI Take 160 mg by mouth at bedtime.   feeding supplement (PRO-STAT SUGAR FREE 64) Liqd Take 30 mLs by mouth 2 (two) times daily. Give for 30 days. Stop date: 07/06/16. Document amount consumed.   Ipratropium-Albuterol 20-100 MCG/ACT Aers respimat Commonly known as:  COMBIVENT RESPIMAT Inhale 1 puff into the lungs every 6 (six) hours as needed for shortness of breath.      No Known Allergies    The results of significant diagnostics from this hospitalization (including imaging, microbiology, ancillary and laboratory) are listed below for  reference.    Significant Diagnostic Studies: Ct Head Wo Contrast  Result Date: 09/28/2016 CLINICAL DATA:  Pain after fall. EXAM: CT HEAD WITHOUT CONTRAST TECHNIQUE: Contiguous axial images were obtained from the base of the skull through the vertex without intravenous contrast. COMPARISON:  May 21, 2016 FINDINGS: Brain: No subdural, epidural, or subarachnoid hemorrhage. No mass, mass effect, or midline shift. Ventricles and sulci are stable. Cerebellum, brainstem, and basal cisterns are unchanged and unremarkable. Focal encephalomalacia in the right parietal lobe is stable. No acute cortical ischemia or infarct. Vascular: Calcified atherosclerosis seen in the intracranial carotid arteries. Skull: Normal. Negative for fracture or focal lesion. Sinuses/Orbits: No acute finding. Other: None. IMPRESSION: 1. No acute intracranial abnormality. Electronically Signed   By: Dorise Bullion III M.D   On: 09/28/2016 20:00   US Renal  Result Date: 09/29/2016 CLINICAL DATA:  Hematuria, chronic kidney disease EXAM: RENAL / URINARY TRACT ULTRASOUND  COMPLETE COMPARISON:  01/07/2006 CT without contrast FINDINGS: Right Kidney: Length: 10.1 cm. Echogenicity within normal limits. No mass or hydronephrosis visualized. Left Kidney: Length: 10.5 cm. Echogenicity within normal limits. No mass or hydronephrosis visualized. Bladder: Appears normal for degree of bladder distention. IMPRESSION: Normal renal ultrasound for age. No acute finding or hydronephrosis. Electronically Signed   By: Jerilynn Mages.  Shick M.D.   On: 09/29/2016 11:18   Dg Hips Bilat With Pelvis 3-4 Views  Result Date: 09/28/2016 CLINICAL DATA:  82 year old male with history of fall after using the bathroom today. Bright red blood in stool for the 3 times today. Pain in the left hip. EXAM: DG HIP (WITH OR WITHOUT PELVIS) 3-4V BILAT COMPARISON:  No priors. FINDINGS: AP view of the bony pelvis and AP and lateral views of the hips bilaterally demonstrate no acute displaced  fractures of the bony pelvic ring or visualized portions of either proximal femur. Both hips are located. There is joint space narrowing, subchondral sclerosis, subchondral cyst formation and osteophyte formation in the hip joints bilaterally, compatible with moderate osteoarthritis. Numerous surgical clips are noted throughout the anatomic pelvis, likely from prior prostatectomy and lymph node dissection. IMPRESSION: 1. No acute radiographic abnormality of the bony pelvis or either hip. 2. Moderate bilateral hip joint osteoarthritis. Electronically Signed   By: Vinnie Langton M.D.   On: 09/28/2016 18:02    Microbiology: No results found for this or any previous visit (from the past 240 hour(s)).   Labs: Basic Metabolic Panel:  Recent Labs Lab 09/28/16 1644 09/29/16 0551  NA 139 140  K 4.1 3.6  CL 105 104  CO2 27 27  GLUCOSE 118* 120*  BUN 25* 24*  CREATININE 1.11 1.03  CALCIUM 9.3 8.9  MG  --  1.8  PHOS  --  3.7   Liver Function Tests:  Recent Labs Lab 09/29/16 0551  AST 50*  ALT 18  ALKPHOS 78  BILITOT 0.7  PROT 5.8*  ALBUMIN 3.1*   No results for input(s): LIPASE, AMYLASE in the last 168 hours. No results for input(s): AMMONIA in the last 168 hours. CBC:  Recent Labs Lab 09/28/16 1644 09/29/16 0551  WBC 11.5* 8.7  NEUTROABS 8.8*  --   HGB 12.1* 11.1*  HCT 35.0* 33.5*  MCV 82.9 85.2  PLT 161 158   Cardiac Enzymes:  Recent Labs Lab 09/28/16 2317 09/29/16 0551 09/29/16 1025  TROPONINI 0.07* 0.05* 0.04*   BNP: BNP (last 3 results)  Recent Labs  05/22/16 0111 05/29/16 1800  BNP 408.9* 340.4*    ProBNP (last 3 results) No results for input(s): PROBNP in the last 8760 hours.  CBG: No results for input(s): GLUCAP in the last 168 hours.     Signed:  Annita Brod, MD [facility] 09/29/2016, 1:38 PM

## 2016-09-29 NOTE — Progress Notes (Signed)
Completed D/C teaching. Home health needs set up. Discussed new prescriptions with patient and family. Patient will be D/C in stable condition with family.

## 2016-10-01 LAB — URINE CULTURE: Culture: >=100000 — AB

## 2017-01-03 ENCOUNTER — Telehealth: Payer: Self-pay | Admitting: *Deleted

## 2017-01-03 NOTE — Telephone Encounter (Signed)
Called [facility] and was told that pt had 10-12 days of prostate medicine and that was last Friday. can you put pt on for new pt [place] on 3/12 at 2;30. He lives at [facility] and I called and spoke to [employer] and she will let family know to transport the pt and if she has problems then she will call me back

## 2017-01-07 ENCOUNTER — Inpatient Hospital Stay: Payer: Medicare Other | Admitting: Oncology

## 2017-01-14 IMAGING — CT CT HEAD W/O CM
3 of 4 series · 15 of 47 positions shown, 18 images · non-contrast
Comparison: May 21, 2016

CLINICAL DATA: Pain after fall.

EXAM:
CT HEAD WITHOUT CONTRAST
TECHNIQUE: Contiguous axial images were obtained from the base of the skull
through the vertex without intravenous contrast.

[Series 3: head w/o · axial · non-contrast · 0.45mm/px · z∈[-190,-70]mm · 9 of 31 slices shown, 12 images]
[im 4/31  brain]
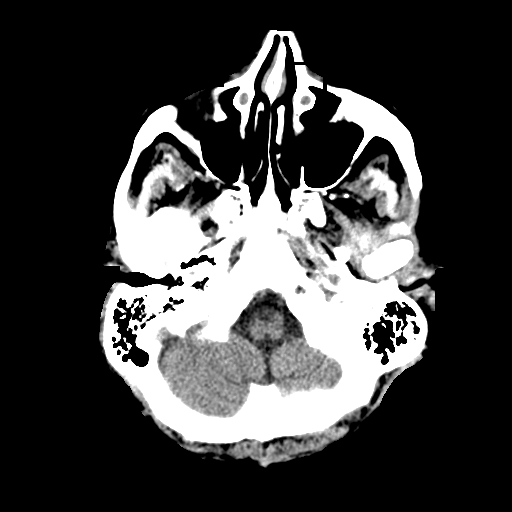
[im 4/31  bone]
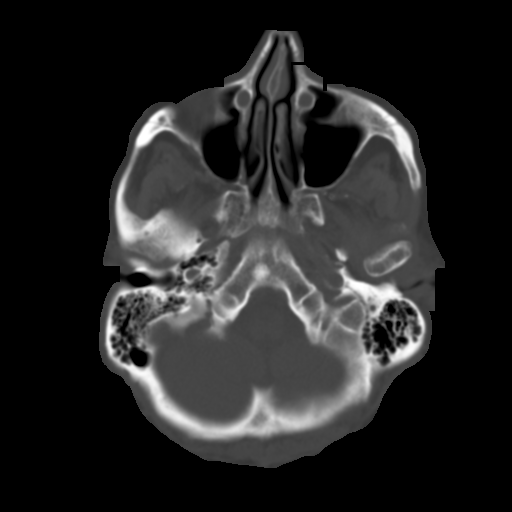
[im 7/31  brain]
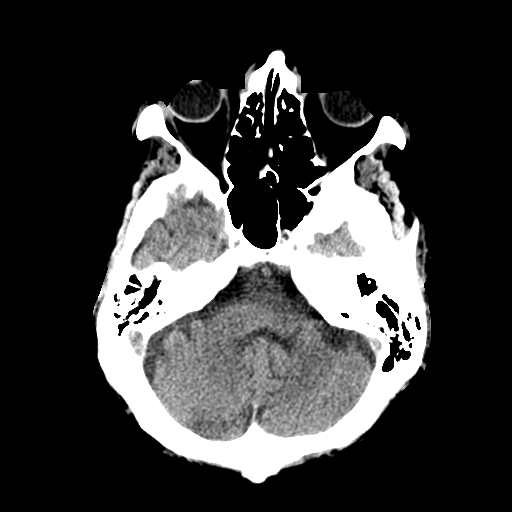
[im 10/31  brain]
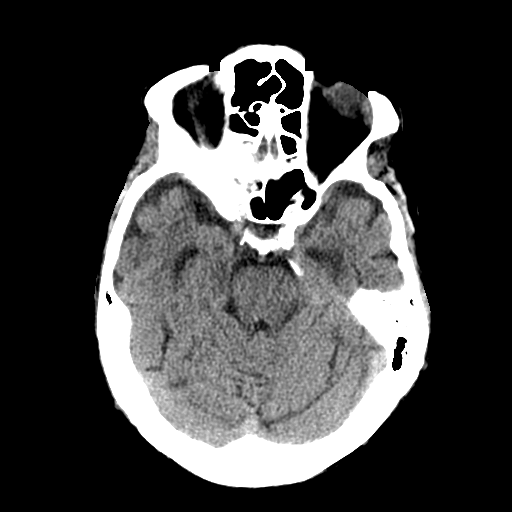
[im 13/31  brain]
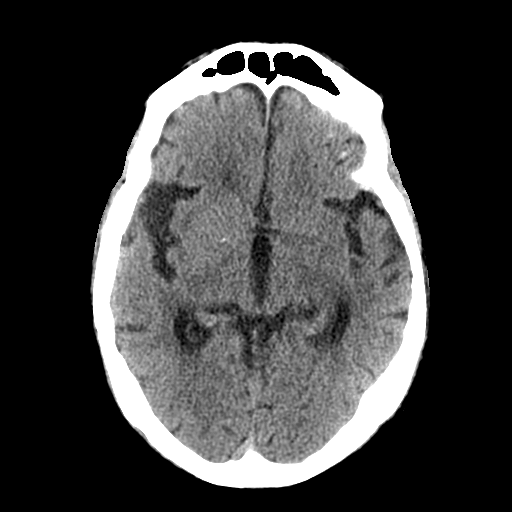
[im 16/31  brain]
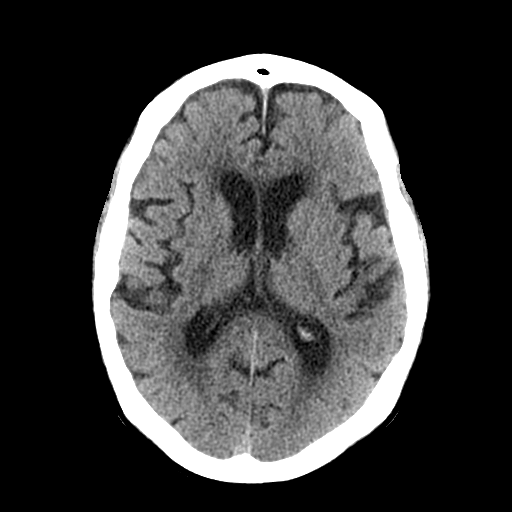
[im 16/31  bone]
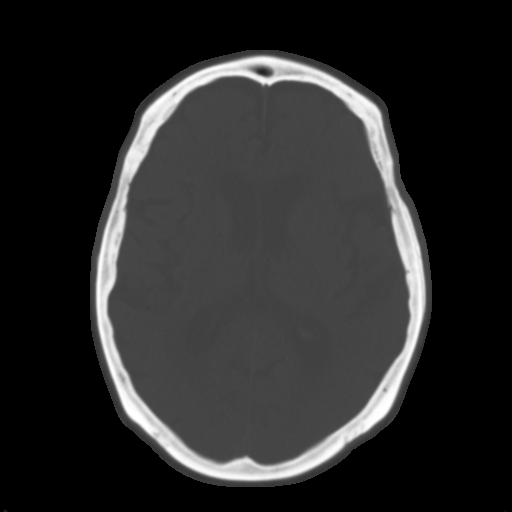
[im 19/31  brain]
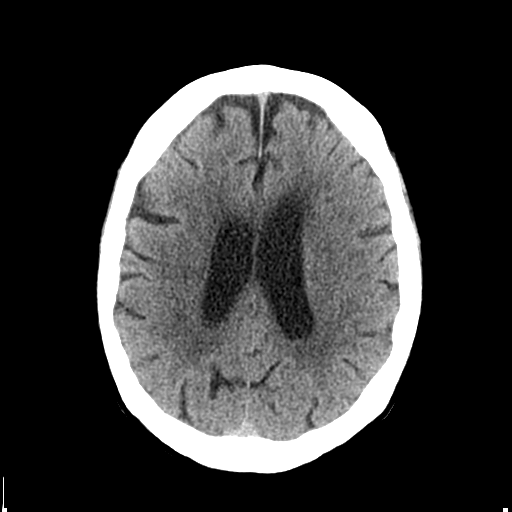
[im 22/31  brain]
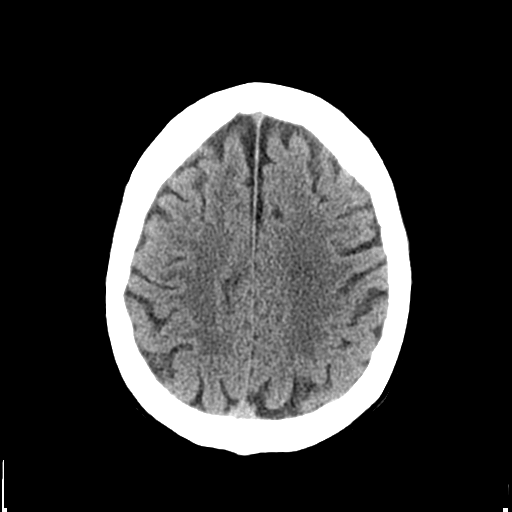
[im 25/31  brain]
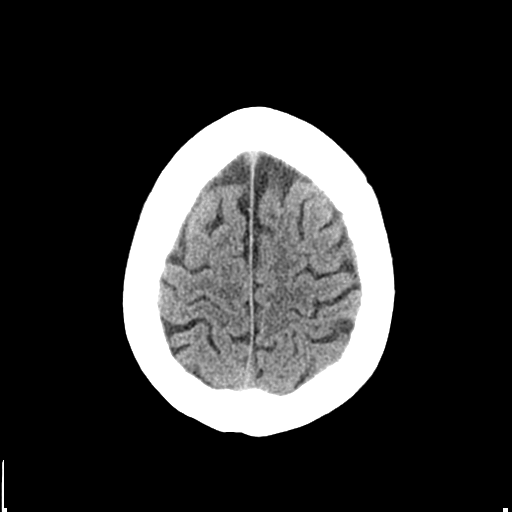
[im 28/31  brain]
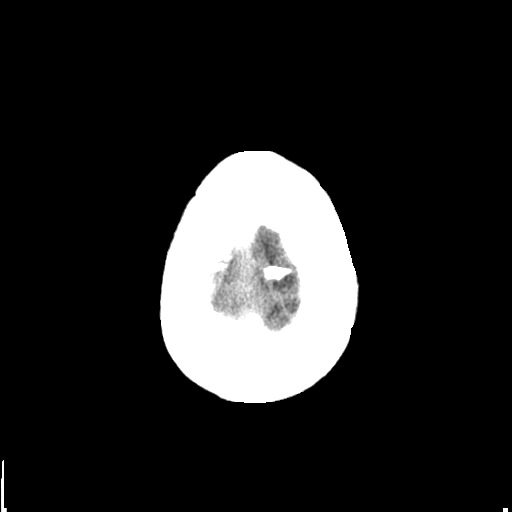
[im 28/31  bone]
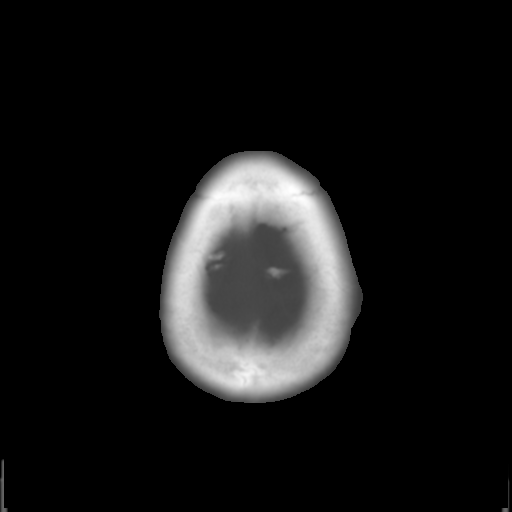

[Series 5: coronal · coronal · 0.29mm/px · 3 of 68 slices shown]
[im 23/68  brain]
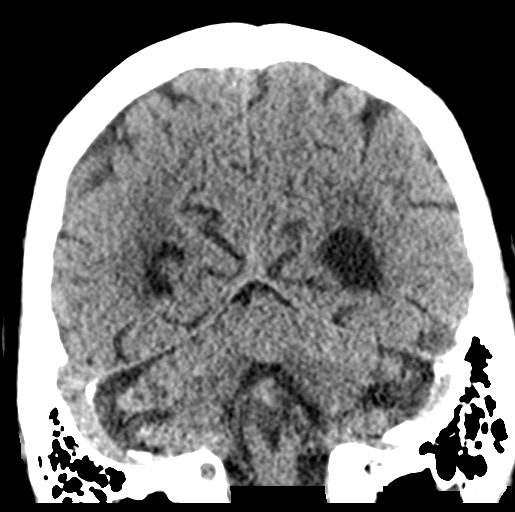
[im 30/68  brain]
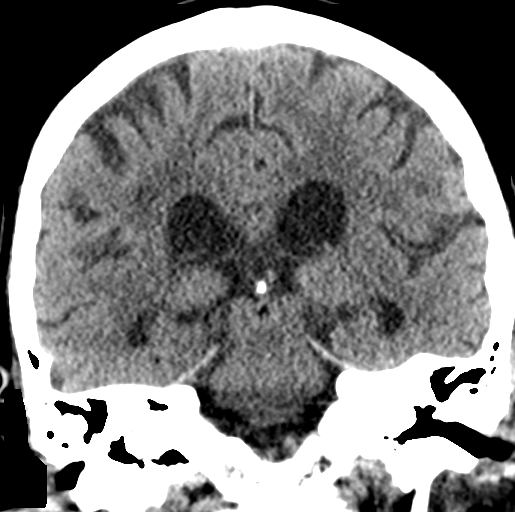
[im 38/68  brain]
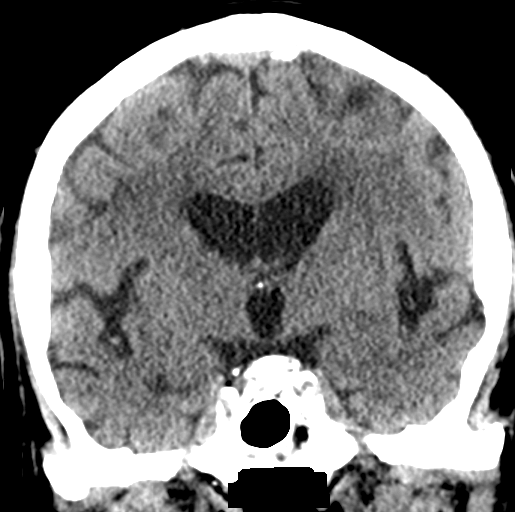

[Series 6: sagittal · sagittal · 0.31mm/px · 3 of 55 slices shown]
[im 19/55  brain]
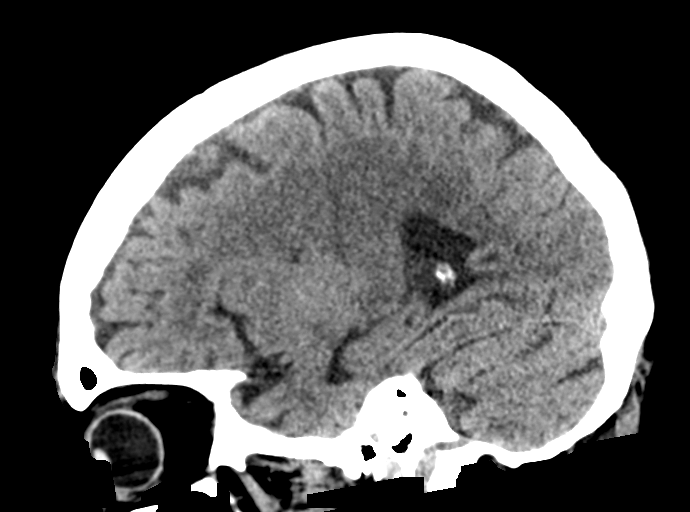
[im 28/55  brain]
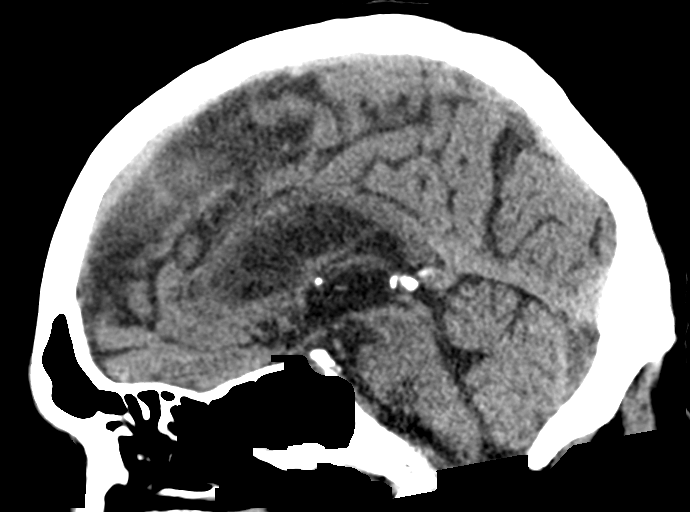
[im 37/55  brain]
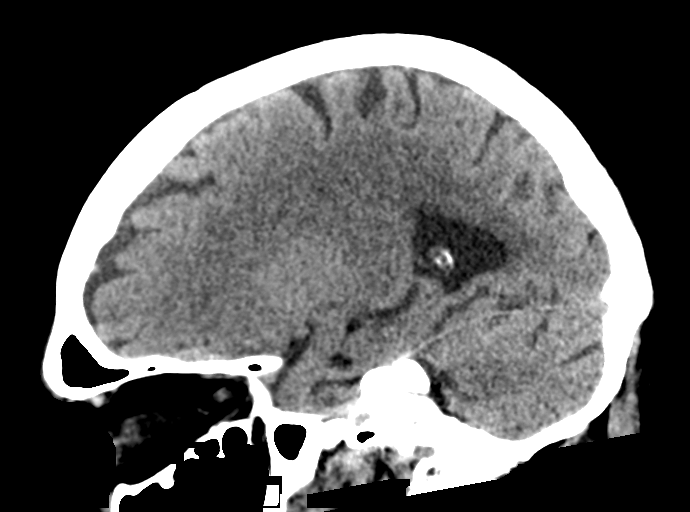

[15 of 47 positions shown; findings below may reference images not displayed]

FINDINGS: Brain: No subdural, epidural, or subarachnoid hemorrhage. No mass,
mass effect, or midline shift. Ventricles and sulci are stable.
Cerebellum, brainstem, and basal cisterns are unchanged and
unremarkable. Focal encephalomalacia in the right parietal lobe is
stable. No acute cortical ischemia or infarct.

Vascular: Calcified atherosclerosis seen in the intracranial carotid
arteries.

Skull: Normal. Negative for fracture or focal lesion.

Sinuses/Orbits: No acute finding.

Other: None.
IMPRESSION: 1. No acute intracranial abnormality.

## 2017-02-04 ENCOUNTER — Telehealth: Payer: Self-pay

## 2017-02-04 NOTE — Telephone Encounter (Signed)
LVM to reschedule NP appt.  Waiting on a return call.

## 2017-02-11 ENCOUNTER — Encounter: Payer: Self-pay | Admitting: Oncology

## 2017-02-11 ENCOUNTER — Inpatient Hospital Stay: Payer: Medicare Other

## 2017-02-11 ENCOUNTER — Inpatient Hospital Stay: Payer: Medicare Other | Attending: Oncology | Admitting: Oncology

## 2017-02-11 VITALS — BP 122/71 | HR 72 | Temp 97.0°F | Ht 70.0 in

## 2017-02-11 DIAGNOSIS — Z87440 Personal history of urinary (tract) infections: Secondary | ICD-10-CM | POA: Insufficient documentation

## 2017-02-11 DIAGNOSIS — R29898 Other symptoms and signs involving the musculoskeletal system: Secondary | ICD-10-CM

## 2017-02-11 DIAGNOSIS — R531 Weakness: Secondary | ICD-10-CM | POA: Diagnosis not present

## 2017-02-11 DIAGNOSIS — N183 Chronic kidney disease, stage 3 (moderate): Secondary | ICD-10-CM | POA: Insufficient documentation

## 2017-02-11 DIAGNOSIS — Z79899 Other long term (current) drug therapy: Secondary | ICD-10-CM | POA: Insufficient documentation

## 2017-02-11 DIAGNOSIS — M6282 Rhabdomyolysis: Secondary | ICD-10-CM | POA: Diagnosis not present

## 2017-02-11 DIAGNOSIS — R262 Difficulty in walking, not elsewhere classified: Secondary | ICD-10-CM | POA: Insufficient documentation

## 2017-02-11 DIAGNOSIS — I129 Hypertensive chronic kidney disease with stage 1 through stage 4 chronic kidney disease, or unspecified chronic kidney disease: Secondary | ICD-10-CM | POA: Diagnosis not present

## 2017-02-11 DIAGNOSIS — Z319 Encounter for procreative management, unspecified: Secondary | ICD-10-CM

## 2017-02-11 DIAGNOSIS — Z7901 Long term (current) use of anticoagulants: Secondary | ICD-10-CM | POA: Diagnosis not present

## 2017-02-11 DIAGNOSIS — R319 Hematuria, unspecified: Secondary | ICD-10-CM | POA: Insufficient documentation

## 2017-02-11 DIAGNOSIS — G620 Drug-induced polyneuropathy: Secondary | ICD-10-CM | POA: Diagnosis not present

## 2017-02-11 DIAGNOSIS — Z993 Dependence on wheelchair: Secondary | ICD-10-CM | POA: Insufficient documentation

## 2017-02-11 DIAGNOSIS — C61 Malignant neoplasm of prostate: Secondary | ICD-10-CM

## 2017-02-11 DIAGNOSIS — C7951 Secondary malignant neoplasm of bone: Secondary | ICD-10-CM | POA: Diagnosis not present

## 2017-02-11 DIAGNOSIS — G9340 Encephalopathy, unspecified: Secondary | ICD-10-CM | POA: Insufficient documentation

## 2017-02-11 DIAGNOSIS — E039 Hypothyroidism, unspecified: Secondary | ICD-10-CM | POA: Insufficient documentation

## 2017-02-11 DIAGNOSIS — Z923 Personal history of irradiation: Secondary | ICD-10-CM | POA: Diagnosis not present

## 2017-02-11 LAB — COMPREHENSIVE METABOLIC PANEL
ALBUMIN: 3.2 g/dL — AB (ref 3.5–5.0)
ALK PHOS: 147 U/L — AB (ref 38–126)
ALT: 8 U/L — ABNORMAL LOW (ref 17–63)
ANION GAP: 3 — AB (ref 5–15)
AST: 15 U/L (ref 15–41)
BILIRUBIN TOTAL: 0.4 mg/dL (ref 0.3–1.2)
BUN: 28 mg/dL — ABNORMAL HIGH (ref 6–20)
CALCIUM: 9.1 mg/dL (ref 8.9–10.3)
CO2: 29 mmol/L (ref 22–32)
CREATININE: 0.98 mg/dL (ref 0.61–1.24)
Chloride: 104 mmol/L (ref 101–111)
GFR calc non Af Amer: >60 mL/min (ref >60)
Glucose, Bld: 139 mg/dL — ABNORMAL HIGH (ref 65–99)
Potassium: 3.7 mmol/L (ref 3.5–5.1)
Sodium: 136 mmol/L (ref 135–145)
TOTAL PROTEIN: 6.5 g/dL (ref 6.5–8.1)

## 2017-02-11 LAB — CBC WITH DIFFERENTIAL/PLATELET
BASOS ABS: 0.1 10*3/uL (ref 0–0.1)
BASOS PCT: 1 %
Eosinophils Absolute: 0.2 10*3/uL (ref 0–0.7)
Eosinophils Relative: 3 %
HEMATOCRIT: 36.1 % — AB (ref 40.0–52.0)
HEMOGLOBIN: 12.1 g/dL — AB (ref 13.0–18.0)
LYMPHS PCT: 24 %
Lymphs Abs: 1.6 10*3/uL (ref 1.0–3.6)
MCH: 29.1 pg (ref 26.0–34.0)
MCHC: 33.6 g/dL (ref 32.0–36.0)
MCV: 86.5 fL (ref 80.0–100.0)
Monocytes Absolute: 0.4 10*3/uL (ref 0.2–1.0)
Monocytes Relative: 6 %
NEUTROS ABS: 4.4 10*3/uL (ref 1.4–6.5)
NEUTROS PCT: 66 %
Platelets: 218 10*3/uL (ref 150–440)
RBC: 4.17 MIL/uL — AB (ref 4.40–5.90)
RDW: 14.6 % — ABNORMAL HIGH (ref 11.5–14.5)
WBC: 6.8 10*3/uL (ref 3.8–10.6)

## 2017-02-11 NOTE — Progress Notes (Signed)
Patient here for initial visit. He has declined over the past 3 three months, he is now unable to walk independently. He has severe lower extremity neuropathy.

## 2017-02-12 LAB — PSA: PSA: 81.94 ng/mL — AB (ref 0.00–4.00)
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
## 2017-02-14 ENCOUNTER — Encounter: Payer: Self-pay | Admitting: Oncology

## 2017-02-26 ENCOUNTER — Telehealth: Payer: Self-pay

## 2017-02-26 NOTE — Telephone Encounter (Signed)
Call to pts wife Richard Jacobs to infom re lab work Dr Janese Banks would like to do mid May and mid June - she stated this was fine. informed her home health would go into [facility] to draw labs.  Call to [name] [phone] and left message for son (poa) w this same info.about lab work . Left message on his voice mail to call back [phone]. CAW

## 2017-02-27 NOTE — Telephone Encounter (Signed)
Richard Jacobs called back to get message from yesterday, I read Richard Jacobs note to him regarding lab to be drawn mid May and mid June by [place] nurse at [facility]. He asked if we were notifying [facility] of this.

## 2017-02-27 NOTE — Telephone Encounter (Signed)
He was fine with it being done

## 2017-02-27 NOTE — Telephone Encounter (Signed)
We called [facility] and they told us that the labs would have to be done through home health agency and gave [place] 2 names and said they would do it.  We called [facility] and they are able to do it. We just wanted to make sure family ok with it

## 2017-03-04 ENCOUNTER — Telehealth: Payer: Self-pay | Admitting: *Deleted

## 2017-03-04 NOTE — Telephone Encounter (Signed)
Called and left message about what med he was speaking of. They spoke about zometa and then they spoke about IV taxotere in which pt did not want taxotere. Await call back.  I also explained in my message that we were told today that home health will not come out for blood work only for pt. He has to have skilled needs and then labs could be part of the package.

## 2017-03-04 NOTE — Telephone Encounter (Signed)
-----   Message from Secundino Ginger sent at 03/04/2017 10:31 AM EDT ----- Regarding: Babs Sciara Contact: [phone] Richardson Landry His son called and wants to know about the new drug that his dad Richard Jacobs is going to get. Please call him @ [phone]

## 2017-03-05 ENCOUNTER — Telehealth: Payer: Self-pay | Admitting: *Deleted

## 2017-03-05 NOTE — Telephone Encounter (Signed)
I called son and left message yest. And he called me and left message today and I rtned call today and he was told by his mom who has some dementia that Dr. Janese Banks office called and said that we started him onnew med.  I asked Dr. Janese Banks about this and I see no notes about it and I called [facility] and asked Wendelyn Breslow the person who gives medication and there is no new med and the last med added to his list was in March and it was not by Janese Banks.  We did call the wife last week to see if they were agreeable to have home health come to [facility] and draw blood and I dont' know if that is what she is speaking about.  Since we called [facility] and was told that home health can do labs and then we [facility] ref. To [facility] they can't take referral because you have to have skilled needs and they can add on blood work but they will not get paid for lab work alone so pt will need to come here for lab only and the son told me to arrange it through [facility] for them to bring him over once in May and once in June and then I will call him back with details.

## 2017-03-05 NOTE — Telephone Encounter (Signed)
-----   Message from Secundino Ginger sent at 03/04/2017 10:31 AM EDT ----- Regarding: Babs Sciara Contact: [phone] Richard Jacobs His son called and wants to know about the new drug that his dad Richard Jacobs is going to get. Please call him @ [phone]

## 2017-03-06 ENCOUNTER — Other Ambulatory Visit: Payer: Self-pay | Admitting: *Deleted

## 2017-03-06 ENCOUNTER — Telehealth: Payer: Self-pay | Admitting: *Deleted

## 2017-03-06 DIAGNOSIS — C7951 Secondary malignant neoplasm of bone: Secondary | ICD-10-CM

## 2017-03-06 DIAGNOSIS — C61 Malignant neoplasm of prostate: Secondary | ICD-10-CM

## 2017-03-06 NOTE — Telephone Encounter (Signed)
Called [facility] and spoke to [facility] about having pt come to cancer center for labs.  It will be take 15 min for the labs.  Melanie and I have worked out a lab only appt for 5/21 at 2:30 and then a June appt  Lab only for 6/18 at 2:30.  They will transport him here to cancer center.  Can you put him on schedule. The facility already knows about it.  I called the son of the patient Richardson Landry and told him that I have arranged it and gave him the date and times of the visits.

## 2017-03-18 ENCOUNTER — Inpatient Hospital Stay: Payer: Medicare Other | Attending: Oncology

## 2017-03-18 DIAGNOSIS — C7951 Secondary malignant neoplasm of bone: Secondary | ICD-10-CM

## 2017-03-18 DIAGNOSIS — C61 Malignant neoplasm of prostate: Secondary | ICD-10-CM | POA: Diagnosis not present

## 2017-03-18 LAB — COMPREHENSIVE METABOLIC PANEL
ALBUMIN: 3.4 g/dL — AB (ref 3.5–5.0)
ALT: 10 U/L — ABNORMAL LOW (ref 17–63)
ANION GAP: 6 (ref 5–15)
AST: 16 U/L (ref 15–41)
Alkaline Phosphatase: 165 U/L — ABNORMAL HIGH (ref 38–126)
BUN: 27 mg/dL — ABNORMAL HIGH (ref 6–20)
CHLORIDE: 105 mmol/L (ref 101–111)
CO2: 27 mmol/L (ref 22–32)
Calcium: 9.3 mg/dL (ref 8.9–10.3)
Creatinine, Ser: 1.03 mg/dL (ref 0.61–1.24)
GFR calc Af Amer: >60 mL/min (ref >60)
GFR calc non Af Amer: >60 mL/min (ref >60)
GLUCOSE: 127 mg/dL — AB (ref 65–99)
POTASSIUM: 4.1 mmol/L (ref 3.5–5.1)
SODIUM: 138 mmol/L (ref 135–145)
Total Bilirubin: 0.5 mg/dL (ref 0.3–1.2)
Total Protein: 6.4 g/dL — ABNORMAL LOW (ref 6.5–8.1)

## 2017-03-18 LAB — CBC WITH DIFFERENTIAL/PLATELET
BASOS PCT: 1 %
Basophils Absolute: 0.1 10*3/uL (ref 0–0.1)
EOS ABS: 0.3 10*3/uL (ref 0–0.7)
Eosinophils Relative: 4 %
HCT: 35.4 % — ABNORMAL LOW (ref 40.0–52.0)
Hemoglobin: 12.3 g/dL — ABNORMAL LOW (ref 13.0–18.0)
Lymphocytes Relative: 27 %
Lymphs Abs: 1.8 10*3/uL (ref 1.0–3.6)
MCH: 30 pg (ref 26.0–34.0)
MCHC: 34.6 g/dL (ref 32.0–36.0)
MCV: 86.7 fL (ref 80.0–100.0)
MONO ABS: 0.4 10*3/uL (ref 0.2–1.0)
MONOS PCT: 6 %
Neutro Abs: 4.2 10*3/uL (ref 1.4–6.5)
Neutrophils Relative %: 62 %
Platelets: 216 10*3/uL (ref 150–440)
RBC: 4.08 MIL/uL — ABNORMAL LOW (ref 4.40–5.90)
RDW: 15 % — AB (ref 11.5–14.5)
WBC: 6.8 10*3/uL (ref 3.8–10.6)

## 2017-03-18 LAB — PSA: PSA: 109.33 ng/mL — ABNORMAL HIGH (ref 0.00–4.00)

## 2017-04-01 ENCOUNTER — Telehealth: Payer: Self-pay | Admitting: *Deleted

## 2017-04-01 NOTE — Telephone Encounter (Signed)
Got a call from pt's son. He and his brother went to see their Dad and had a discussion with him about the rising PSA and whether to stop the xtandi.  After a long talk the patient wants to stop medication.  I also told the son that  Dr. Janese Banks wanted me to cancel the June labs that were previously scheduled.  The son is agreeable to the labs and he has contacted Dr. Marcello Moores to see if he had to write d/c order since technically pt is still receiving the med from [facility].  I told the son that the order should come from our office because he is being managed by Dr. Janese Banks now.  I told the son I will call [facility] and speak with staff there.  I called [facility] and was told the person I need to speak to is on the phone and then she will call me back.

## 2017-04-02 NOTE — Telephone Encounter (Signed)
rcvd call back from Cat at [facility] and she needs order to stop the drug and also to d/c the last visit for June. I got Dr. Grayland Ormond covering for Dr. Janese Banks to sign the order to d/c xtandi and the labd draw in June but pt should keep his visit in July already scheduled and faxed it to attn cat [phone].

## 2017-04-03 ENCOUNTER — Telehealth: Payer: Self-pay | Admitting: *Deleted

## 2017-04-03 NOTE — Telephone Encounter (Signed)
-----   Message from Secundino Ginger sent at 04/03/2017  2:54 PM EDT ----- Regarding: Hospice Contact: [phone] Richardson Landry his son called and is ready to move forward with Hospice. [facility] [place]. He has already talked to Hospice and they just need an order to proceed.

## 2017-04-03 NOTE — Telephone Encounter (Signed)
Called pt's home and spoke to [place] one of the sons and he said they are all in agreement for hospice. I explained that we would fill out paper in am and have md sign it and fax to hospice and they will call [facility] to get permission to open case and then call family to set up appt. And Waunita Schooner said to call Richardson Landry and I could not remember if the phone number I have for him is his office number or cell phone and he gave me cell phone [phone]. I then called Richardson Landry and told him the same above.  He had already spoke to hospice and Horris Latino and told him same info I just said to him too. He will be the point of contact for this and I will enter it on the form. Son is agreeable.

## 2017-04-15 ENCOUNTER — Inpatient Hospital Stay: Attending: Oncology

## 2017-04-15 DIAGNOSIS — C7951 Secondary malignant neoplasm of bone: Secondary | ICD-10-CM

## 2017-04-15 DIAGNOSIS — C61 Malignant neoplasm of prostate: Secondary | ICD-10-CM | POA: Diagnosis not present

## 2017-04-15 LAB — COMPREHENSIVE METABOLIC PANEL
ALT: 12 U/L — AB (ref 17–63)
AST: 23 U/L (ref 15–41)
Albumin: 2.8 g/dL — ABNORMAL LOW (ref 3.5–5.0)
Alkaline Phosphatase: 142 U/L — ABNORMAL HIGH (ref 38–126)
Anion gap: 8 (ref 5–15)
BILIRUBIN TOTAL: 0.5 mg/dL (ref 0.3–1.2)
BUN: 26 mg/dL — AB (ref 6–20)
CHLORIDE: 103 mmol/L (ref 101–111)
CO2: 27 mmol/L (ref 22–32)
CREATININE: 1.03 mg/dL (ref 0.61–1.24)
Calcium: 8.4 mg/dL — ABNORMAL LOW (ref 8.9–10.3)
GFR calc Af Amer: >60 mL/min (ref >60)
Glucose, Bld: 142 mg/dL — ABNORMAL HIGH (ref 65–99)
Potassium: 4 mmol/L (ref 3.5–5.1)
Sodium: 138 mmol/L (ref 135–145)
TOTAL PROTEIN: 5.9 g/dL — AB (ref 6.5–8.1)

## 2017-04-15 LAB — CBC WITH DIFFERENTIAL/PLATELET
BASOS ABS: 0.1 10*3/uL (ref 0–0.1)
Basophils Relative: 1 %
Eosinophils Absolute: 0.2 10*3/uL (ref 0–0.7)
Eosinophils Relative: 3 %
HEMATOCRIT: 31.1 % — AB (ref 40.0–52.0)
HEMOGLOBIN: 10.8 g/dL — AB (ref 13.0–18.0)
LYMPHS PCT: 20 %
Lymphs Abs: 1.5 10*3/uL (ref 1.0–3.6)
MCH: 30.4 pg (ref 26.0–34.0)
MCHC: 34.8 g/dL (ref 32.0–36.0)
MCV: 87.1 fL (ref 80.0–100.0)
Monocytes Absolute: 0.7 10*3/uL (ref 0.2–1.0)
Monocytes Relative: 8 %
NEUTROS ABS: 5.4 10*3/uL (ref 1.4–6.5)
NEUTROS PCT: 68 %
Platelets: 217 10*3/uL (ref 150–440)
RBC: 3.57 MIL/uL — AB (ref 4.40–5.90)
RDW: 14.4 % (ref 11.5–14.5)
WBC: 7.9 10*3/uL (ref 3.8–10.6)

## 2017-04-15 LAB — PSA: PROSTATIC SPECIFIC ANTIGEN: 146.06 ng/mL — AB (ref 0.00–4.00)

## 2017-05-06 ENCOUNTER — Other Ambulatory Visit: Payer: Self-pay | Admitting: Oncology

## 2017-05-07 ENCOUNTER — Other Ambulatory Visit: Payer: Self-pay | Admitting: *Deleted

## 2017-05-07 ENCOUNTER — Other Ambulatory Visit: Payer: Medicare Other

## 2017-05-07 ENCOUNTER — Ambulatory Visit: Payer: Medicare Other | Admitting: Oncology

## 2017-05-07 MED ORDER — DEXAMETHASONE 4 MG PO TABS: 4.0000 mg | ORAL_TABLET | Freq: Two times a day (BID) | ORAL | 2 refills | Status: AC

## 2017-05-13 ENCOUNTER — Other Ambulatory Visit: Payer: Self-pay | Admitting: Oncology

## 2017-05-14 ENCOUNTER — Inpatient Hospital Stay

## 2017-05-14 ENCOUNTER — Inpatient Hospital Stay: Attending: Oncology | Admitting: Oncology

## 2017-05-14 VITALS — BP 170/70 | HR 71 | Temp 98.3°F | Resp 18

## 2017-05-14 DIAGNOSIS — C61 Malignant neoplasm of prostate: Secondary | ICD-10-CM

## 2017-05-14 DIAGNOSIS — R5383 Other fatigue: Secondary | ICD-10-CM | POA: Diagnosis not present

## 2017-05-14 DIAGNOSIS — C778 Secondary and unspecified malignant neoplasm of lymph nodes of multiple regions: Secondary | ICD-10-CM | POA: Diagnosis not present

## 2017-05-14 DIAGNOSIS — C7951 Secondary malignant neoplasm of bone: Secondary | ICD-10-CM | POA: Insufficient documentation

## 2017-05-14 DIAGNOSIS — N183 Chronic kidney disease, stage 3 (moderate): Secondary | ICD-10-CM | POA: Insufficient documentation

## 2017-05-14 DIAGNOSIS — Z8619 Personal history of other infectious and parasitic diseases: Secondary | ICD-10-CM | POA: Insufficient documentation

## 2017-05-14 DIAGNOSIS — E039 Hypothyroidism, unspecified: Secondary | ICD-10-CM | POA: Diagnosis not present

## 2017-05-14 DIAGNOSIS — I350 Nonrheumatic aortic (valve) stenosis: Secondary | ICD-10-CM | POA: Diagnosis not present

## 2017-05-14 DIAGNOSIS — Z923 Personal history of irradiation: Secondary | ICD-10-CM | POA: Insufficient documentation

## 2017-05-14 DIAGNOSIS — Z993 Dependence on wheelchair: Secondary | ICD-10-CM | POA: Diagnosis not present

## 2017-05-14 DIAGNOSIS — R011 Cardiac murmur, unspecified: Secondary | ICD-10-CM | POA: Diagnosis not present

## 2017-05-14 DIAGNOSIS — Z79899 Other long term (current) drug therapy: Secondary | ICD-10-CM | POA: Diagnosis not present

## 2017-05-14 DIAGNOSIS — Z86718 Personal history of other venous thrombosis and embolism: Secondary | ICD-10-CM | POA: Insufficient documentation

## 2017-05-14 DIAGNOSIS — Z8673 Personal history of transient ischemic attack (TIA), and cerebral infarction without residual deficits: Secondary | ICD-10-CM | POA: Diagnosis not present

## 2017-05-14 LAB — CBC WITH DIFFERENTIAL/PLATELET
Basophils Absolute: 0 10*3/uL (ref 0–0.1)
Basophils Relative: 0 %
EOS ABS: 0 10*3/uL (ref 0–0.7)
Eosinophils Relative: 0 %
HCT: 34.3 % — ABNORMAL LOW (ref 40.0–52.0)
HEMOGLOBIN: 11.5 g/dL — AB (ref 13.0–18.0)
Lymphocytes Relative: 3 %
Lymphs Abs: 0.5 10*3/uL — ABNORMAL LOW (ref 1.0–3.6)
MCH: 29.2 pg (ref 26.0–34.0)
MCHC: 33.6 g/dL (ref 32.0–36.0)
MCV: 86.9 fL (ref 80.0–100.0)
MONOS PCT: 3 %
Monocytes Absolute: 0.5 10*3/uL (ref 0.2–1.0)
NEUTROS PCT: 94 %
Neutro Abs: 14.3 10*3/uL — ABNORMAL HIGH (ref 1.4–6.5)
PLATELETS: 255 10*3/uL (ref 150–440)
RBC: 3.95 MIL/uL — ABNORMAL LOW (ref 4.40–5.90)
RDW: 14.8 % — ABNORMAL HIGH (ref 11.5–14.5)
WBC: 15.3 10*3/uL — AB (ref 3.8–10.6)

## 2017-05-14 LAB — COMPREHENSIVE METABOLIC PANEL
ALBUMIN: 2.9 g/dL — AB (ref 3.5–5.0)
ALK PHOS: 156 U/L — AB (ref 38–126)
ALT: 39 U/L (ref 17–63)
ANION GAP: 5 (ref 5–15)
AST: 17 U/L (ref 15–41)
BUN: 49 mg/dL — ABNORMAL HIGH (ref 6–20)
CALCIUM: 8.7 mg/dL — AB (ref 8.9–10.3)
CHLORIDE: 107 mmol/L (ref 101–111)
CO2: 26 mmol/L (ref 22–32)
Creatinine, Ser: 1.13 mg/dL (ref 0.61–1.24)
GFR calc Af Amer: >60 mL/min (ref >60)
GFR calc non Af Amer: 56 mL/min — ABNORMAL LOW (ref >60)
GLUCOSE: 158 mg/dL — AB (ref 65–99)
POTASSIUM: 4.9 mmol/L (ref 3.5–5.1)
Sodium: 138 mmol/L (ref 135–145)
Total Bilirubin: 0.4 mg/dL (ref 0.3–1.2)
Total Protein: 6.3 g/dL — ABNORMAL LOW (ref 6.5–8.1)

## 2017-05-14 LAB — PSA: Prostatic Specific Antigen: 249 ng/mL — ABNORMAL HIGH (ref 0.00–4.00)

## 2017-05-14 NOTE — Progress Notes (Signed)
Here for follow up w multiple family members. Living at [facility] under care of hosp[facility] per family   Repeat BP @ 1526 was 165/72- pt in NAD
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
## 2017-06-06 ENCOUNTER — Encounter: Payer: Self-pay | Admitting: Oncology

## 2017-06-29 DEATH — deceased
